# Patient Record
Sex: Female | Born: 1995 | Race: Black or African American | Hispanic: No | Marital: Single | Smoking: Former smoker
Health system: Southern US, Community
[De-identification: ages and names within clinical notes are randomized; demographics above are authoritative.]

## PROBLEM LIST (undated history)

## (undated) DIAGNOSIS — S0990XA Unspecified injury of head, initial encounter: Secondary | ICD-10-CM

## (undated) DIAGNOSIS — J45909 Unspecified asthma, uncomplicated: Secondary | ICD-10-CM

## (undated) DIAGNOSIS — E669 Obesity, unspecified: Secondary | ICD-10-CM

## (undated) DIAGNOSIS — I10 Essential (primary) hypertension: Secondary | ICD-10-CM

## (undated) DIAGNOSIS — Z8489 Family history of other specified conditions: Secondary | ICD-10-CM

## (undated) DIAGNOSIS — M1990 Unspecified osteoarthritis, unspecified site: Secondary | ICD-10-CM

## (undated) HISTORY — PX: TYMPANOSTOMY TUBE PLACEMENT: SHX32

---
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
## 2015-12-24 ENCOUNTER — Encounter (HOSPITAL_COMMUNITY): Payer: Self-pay | Admitting: Emergency Medicine

## 2015-12-24 ENCOUNTER — Ambulatory Visit (HOSPITAL_COMMUNITY)
Admission: EM | Admit: 2015-12-24 | Discharge: 2015-12-24 | Disposition: A | Discharge status: Home / Self Care | Payer: Self-pay | Attending: Family Medicine | Admitting: Family Medicine

## 2015-12-24 DIAGNOSIS — Z202 Contact with and (suspected) exposure to infections with a predominantly sexual mode of transmission: Secondary | ICD-10-CM | POA: Insufficient documentation

## 2015-12-24 NOTE — ED Notes (Signed)
Here for STD testing.... Reports partner is being tested... She is asymptomatic.... A&O x4... No acute distress.

## 2015-12-24 NOTE — Discharge Instructions (Signed)
Contact Precautions °Some germs can be spread by touching the skin or body fluids of someone else. Contact precautions are rules that help to keep those germs from spreading from one person to another person. °RULES FOR PATIENTS °· Check with your nurse before you leave the room where you are being treated. °· Wear a gown and a bandage (dressing) over the infected area if you need to leave the room where you are being treated. °· Wash your hands often. °RULES FOR VISITORS °· Check with a nurse before you go into a room that has a sign that says "Contact Precautions." °· If a nurse says that you can go in the room, wash your hands and put on a gown and gloves. °· Do not take off your gown or gloves in the room until you are leaving. °· Do not eat or drink in the room unless you ask a nurse first. °· Do not touch anything in the room unless you ask a nurse first. °· Right before you leave the room, take off your gown and then your gloves. Leave them in the room as told. Then wash your hands. °  °This information is not intended to replace advice given to you by your health care provider. Make sure you discuss any questions you have with your health care provider. °  °Document Released: 07/29/2010 Document Revised: 11/10/2014 Document Reviewed: 05/31/2014 °Elsevier Interactive Patient Education ©2016 [employer] ° °

## 2015-12-24 NOTE — ED Provider Notes (Signed)
CSN: [id number]     Arrival date & time 12/24/15  1556 History   None    Chief Complaint  Patient presents with  . Exposure to STD   (Consider location/radiation/quality/duration/timing/severity/associated sxs/prior Treatment) HPI Comments: Patient wants to be checked for STD.  Her boyfriend developed urethritis and urethral discharge and she wants to make sure she does not have std.  She denies any sx's.  Patient is a 20 y.o. female presenting with STD exposure. The history is provided by the patient.  Exposure to STD This is a new problem. The current episode started 12 to 24 hours ago. The problem occurs constantly. The problem has not changed since onset.Nothing aggravates the symptoms. Nothing relieves the symptoms. She has tried nothing for the symptoms.    Past Medical History  Diagnosis Date  . Arthritis    Past Surgical History  Procedure Laterality Date  . Tympanostomy tube placement     No family history on file. Social History  Substance Use Topics  . Smoking status: Current Every Day Smoker -- 2.00 packs/day    Types: Cigarettes  . Smokeless tobacco: None  . Alcohol Use: Yes     Comment: occ   OB History    No data available     Review of Systems  Constitutional: Negative.   HENT: Negative.   Eyes: Negative.   Respiratory: Negative.   Cardiovascular: Negative.   Gastrointestinal: Negative.   Endocrine: Negative.   Genitourinary: Negative.   Musculoskeletal: Negative.   Skin: Negative.   Allergic/Immunologic: Negative.   Neurological: Negative.   Hematological: Negative.   Psychiatric/Behavioral: Negative.     Allergies  Amoxicillin  Home Medications   Prior to Admission medications   Medication Sig Start Date End Date Taking? Authorizing Provider  ibuprofen (ADVIL,MOTRIN) 800 MG tablet Take 1 tablet (800 mg total) by mouth 3 (three) times daily. 11/16/14   Hannah Muthersbaugh, PA-C   Meds Ordered and Administered this Visit  Medications - No  data to display  BP 135/81 mmHg  Pulse 92  Temp(Src) 98.4 F (36.9 C) (Oral)  Resp 18  SpO2 100% No data found.   Physical Exam  Constitutional: She appears well-developed and well-nourished.  HENT:  Head: Normocephalic.  Right Ear: External ear normal.  Left Ear: External ear normal.  Mouth/Throat: Oropharynx is clear and moist.  Eyes: Conjunctivae are normal. Pupils are equal, round, and reactive to light.  Cardiovascular: Normal rate, regular rhythm and normal heart sounds.   Pulmonary/Chest: Effort normal and breath sounds normal.  Abdominal: Soft.  Genitourinary: Vagina normal and uterus normal.  No CMT No adenexal tenderness    ED Course  Procedures (including critical care time)  Labs Review Labs Reviewed  CERVICOVAGINAL ANCILLARY ONLY    Imaging Review No results found.   Visual Acuity Review  Right Eye Distance:   Left Eye Distance:   Bilateral Distance:    Right Eye Near:   Left Eye Near:    Bilateral Near:         MDM  STD exposure Cytology endocervical cx for GC chlamydia and trich.  No PID or pelvic infection seen.  Normal female exam.  Recommend barrier methods until test result.     Deatra CanterWilliam J Oxford, FNP 12/24/15 [phone]

## 2015-12-24 NOTE — ED Notes (Signed)
Call back number verified.... Adv not have SI for x10 days.... Pt verb understading.

## 2015-12-27 LAB — CERVICOVAGINAL ANCILLARY ONLY
Chlamydia: NEGATIVE
Neisseria Gonorrhea: NEGATIVE

## 2015-12-28 LAB — CERVICOVAGINAL ANCILLARY ONLY: Wet Prep (BD Affirm): POSITIVE — AB

## 2016-01-01 ENCOUNTER — Telehealth (HOSPITAL_COMMUNITY): Payer: Self-pay | Admitting: Emergency Medicine

## 2016-01-01 NOTE — ED Notes (Signed)
LM on pt's VM [phone] Need to give lab results from recent visit on  Also let pt know labs can be obtained from MyChart  Per Dr. Honig,  Notes Recorded by Erin J Honig, MD on 12/29/2015 at 6:27 PM Vaginal swabs positive for BV. Negative for chlamydia and gonorrhea. Review of chart shows patient was asymptomatic. Asymptomatic BV does not require treatment. If she has developed a vaginal discharge, please call in Flagyl 500mg BID x7 days #14 no refills. Follow up as needed 

## 2016-01-03 NOTE — ED Notes (Signed)
LM on pt's VM [phone] Need to give lab results from recent visit on  Also let pt know labs can be obtained from MyChart  Per Dr. Piedad ClimesHonig,  Notes Recorded by Charm RingsErin J Honig, MD on 12/29/2015 at 6:27 PM Vaginal swabs positive for BV. Negative for chlamydia and gonorrhea. Review of chart shows patient was asymptomatic. Asymptomatic BV does not require treatment. If she has developed a vaginal discharge, please call in Flagyl 500mg  BID x7 days #14 no refills. Follow up as needed

## 2017-04-15 ENCOUNTER — Encounter (HOSPITAL_COMMUNITY): Payer: Self-pay | Admitting: *Deleted

## 2017-04-15 ENCOUNTER — Emergency Department (HOSPITAL_COMMUNITY)
Admission: EM | Admit: 2017-04-15 | Discharge: 2017-04-15 | Disposition: A | Discharge status: Home / Self Care | Payer: Non-veteran care | Attending: Emergency Medicine | Admitting: Emergency Medicine

## 2017-04-15 DIAGNOSIS — F17210 Nicotine dependence, cigarettes, uncomplicated: Secondary | ICD-10-CM | POA: Insufficient documentation

## 2017-04-15 DIAGNOSIS — R109 Unspecified abdominal pain: Secondary | ICD-10-CM | POA: Insufficient documentation

## 2017-04-15 DIAGNOSIS — R197 Diarrhea, unspecified: Secondary | ICD-10-CM | POA: Insufficient documentation

## 2017-04-15 DIAGNOSIS — R112 Nausea with vomiting, unspecified: Secondary | ICD-10-CM | POA: Insufficient documentation

## 2017-04-15 DIAGNOSIS — Z79899 Other long term (current) drug therapy: Secondary | ICD-10-CM | POA: Insufficient documentation

## 2017-04-15 HISTORY — DX: Obesity, unspecified: E66.9

## 2017-04-15 LAB — PREGNANCY, URINE: Preg Test, Ur: NEGATIVE

## 2017-04-15 LAB — COMPREHENSIVE METABOLIC PANEL
ALK PHOS: 72 U/L (ref 38–126)
ALT: 13 U/L — ABNORMAL LOW (ref 14–54)
AST: 19 U/L (ref 15–41)
Albumin: 3.6 g/dL (ref 3.5–5.0)
Anion gap: 12 (ref 5–15)
BUN: 11 mg/dL (ref 6–20)
CALCIUM: 8.8 mg/dL — AB (ref 8.9–10.3)
CO2: 19 mmol/L — ABNORMAL LOW (ref 22–32)
CREATININE: 1.09 mg/dL — AB (ref 0.44–1.00)
Chloride: 102 mmol/L (ref 101–111)
GFR calc Af Amer: >60 mL/min (ref >60)
GFR calc non Af Amer: >60 mL/min (ref >60)
Glucose, Bld: 104 mg/dL — ABNORMAL HIGH (ref 65–99)
Potassium: 3.4 mmol/L — ABNORMAL LOW (ref 3.5–5.1)
Sodium: 133 mmol/L — ABNORMAL LOW (ref 135–145)
Total Bilirubin: 0.9 mg/dL (ref 0.3–1.2)
Total Protein: 7.6 g/dL (ref 6.5–8.1)

## 2017-04-15 LAB — URINALYSIS, ROUTINE W REFLEX MICROSCOPIC
Bacteria, UA: NONE SEEN
Bilirubin Urine: NEGATIVE
Glucose, UA: NEGATIVE mg/dL
KETONES UR: 20 mg/dL — AB
NITRITE: NEGATIVE
Protein, ur: >=300 mg/dL — AB
RBC / HPF: NONE SEEN RBC/hpf (ref 0–5)
Specific Gravity, Urine: 1.026 (ref 1.005–1.030)
WBC UA: NONE SEEN WBC/hpf (ref 0–5)
pH: 5 (ref 5.0–8.0)

## 2017-04-15 LAB — CBC
HCT: 36.6 % (ref 36.0–46.0)
Hemoglobin: 12.4 g/dL (ref 12.0–15.0)
MCH: 30.4 pg (ref 26.0–34.0)
MCHC: 33.9 g/dL (ref 30.0–36.0)
MCV: 89.7 fL (ref 78.0–100.0)
PLATELETS: 151 10*3/uL (ref 150–400)
RBC: 4.08 MIL/uL (ref 3.87–5.11)
RDW: 13.1 % (ref 11.5–15.5)
WBC: 14.1 10*3/uL — AB (ref 4.0–10.5)

## 2017-04-15 LAB — I-STAT BETA HCG BLOOD, ED (MC, WL, AP ONLY): HCG, QUANTITATIVE: 10.1 m[IU]/mL — AB (ref <5)

## 2017-04-15 LAB — LIPASE, BLOOD: LIPASE: 23 U/L (ref 11–51)

## 2017-04-15 MED ORDER — ALUM & MAG HYDROXIDE-SIMETH 200-200-20 MG/5ML PO SUSP
30.0000 mL | Freq: Once | ORAL | Status: AC
  Administered 2017-04-15: 30 mL via ORAL
  Filled 2017-04-15: qty 30

## 2017-04-15 MED ORDER — ONDANSETRON 4 MG PO TBDP
ORAL_TABLET | ORAL | Status: AC
  Filled 2017-04-15: qty 1

## 2017-04-15 MED ORDER — DICYCLOMINE HCL 20 MG PO TABS: 20.0000 mg | ORAL_TABLET | Freq: Three times a day (TID) | ORAL | 0 refills | Status: DC | PRN

## 2017-04-15 MED ORDER — DICYCLOMINE HCL 10 MG PO CAPS
10.0000 mg | ORAL_CAPSULE | Freq: Once | ORAL | Status: AC
  Administered 2017-04-15: 10 mg via ORAL
  Filled 2017-04-15: qty 1

## 2017-04-15 MED ORDER — ONDANSETRON 4 MG PO TBDP
4.0000 mg | ORAL_TABLET | Freq: Once | ORAL | Status: AC | PRN
  Administered 2017-04-15: 4 mg via ORAL

## 2017-04-15 MED ORDER — ONDANSETRON 4 MG PO TBDP: 4.0000 mg | ORAL_TABLET | Freq: Three times a day (TID) | ORAL | 0 refills | Status: AC | PRN

## 2017-04-15 NOTE — ED Notes (Signed)
Cup of ice water provided to patient - per Dr. Eudelia Bunch. Instructed patient to only take small sips slowly. Verbalized understanding.

## 2017-04-15 NOTE — ED Triage Notes (Signed)
Pt reports having generalized abd pain with n/v/d x 2 days.

## 2017-04-15 NOTE — ED Provider Notes (Signed)
[facility]-EMERGENCY DEPT Provider Note   CSN: [id number] Arrival date & time: 04/15/17  1356     History   Chief Complaint Chief Complaint  Patient presents with  . Abdominal Pain  . Emesis    HPI Dawn Moody is a 21 y.o. female.  The history is provided by the patient.  Abdominal Pain   This is a new problem. The current episode started 2 days ago. Episode frequency: intermittent. Progression since onset: fluctuating. The pain is associated with suspicious food intake. Pain location: lower abdomen. The quality of the pain is cramping. The pain is moderate. Associated symptoms include diarrhea (NB), nausea and vomiting (NBNB). Pertinent negatives include anorexia, fever, hematochezia, melena, dysuria and myalgias. Nothing aggravates the symptoms. Nothing relieves the symptoms.    Past Medical History:  Diagnosis Date  . Arthritis   . Obesity     There are no active problems to display for this patient.   Past Surgical History:  Procedure Laterality Date  . TYMPANOSTOMY TUBE PLACEMENT      OB History    No data available       Home Medications    Prior to Admission medications   Medication Sig Start Date End Date Taking? Authorizing Provider  dicyclomine (BENTYL) 20 MG tablet Take 1 tablet (20 mg total) by mouth 3 (three) times daily as needed for spasms. 04/15/17 04/20/17  Nira Conn, MD  ibuprofen (ADVIL,MOTRIN) 800 MG tablet Take 1 tablet (800 mg total) by mouth 3 (three) times daily. 11/16/14   Muthersbaugh, Dahlia Client, PA-C  ondansetron (ZOFRAN ODT) 4 MG disintegrating tablet Take 1 tablet (4 mg total) by mouth every 8 (eight) hours as needed for nausea or vomiting. 04/15/17 04/18/17  Nira Conn, MD    Family History History reviewed. No pertinent family history.  Social History Social History  Substance Use Topics  . Smoking status: Current Every Day Smoker    Packs/day: 2.00    Types: Cigarettes  . Smokeless tobacco: Not on file  .  Alcohol use Yes     Comment: occ     Allergies   Amoxicillin   Review of Systems Review of Systems  Constitutional: Negative for fever.  Gastrointestinal: Positive for diarrhea (NB), nausea and vomiting (NBNB). Negative for anal bleeding, anorexia, blood in stool, hematochezia and melena.  Genitourinary: Positive for vaginal bleeding (currently on her menstrual cycle.). Negative for dysuria, flank pain and vaginal discharge (no sexual intercourse for 7 months).  Musculoskeletal: Negative for myalgias.  All other systems are reviewed and are negative for acute change except as noted in the HPI    Physical Exam Updated Vital Signs BP 123/65 (BP Location: Left Arm)   Pulse 100   Temp 99.2 F (37.3 C) (Oral)   Resp 18   LMP 04/15/2017   SpO2 98%   Physical Exam  Constitutional: She is oriented to person, place, and time. She appears well-developed and well-nourished. No distress.  HENT:  Head: Normocephalic and atraumatic.  Nose: Nose normal.  Eyes: Pupils are equal, round, and reactive to light. Conjunctivae and EOM are normal. Right eye exhibits no discharge. Left eye exhibits no discharge. No scleral icterus.  Neck: Normal range of motion. Neck supple.  Cardiovascular: Normal rate and regular rhythm.  Exam reveals no gallop and no friction rub.   No murmur heard. Pulmonary/Chest: Effort normal and breath sounds normal. No stridor. No respiratory distress. She has no rales.  Abdominal: Soft. She exhibits no distension. There is tenderness (mild  discomfort) in the left lower quadrant. There is no rigidity, no rebound, no guarding, no CVA tenderness, no tenderness at McBurney's point and negative Murphy's sign.  Musculoskeletal: She exhibits no edema or tenderness.  Neurological: She is alert and oriented to person, place, and time.  Skin: Skin is warm and dry. No rash noted. She is not diaphoretic. No erythema.  Psychiatric: She has a normal mood and affect.  Vitals  reviewed.    ED Treatments / Results  Labs (all labs ordered are listed, but only abnormal results are displayed) Labs Reviewed  COMPREHENSIVE METABOLIC PANEL - Abnormal; Notable for the following:       Result Value   Sodium 133 (*)    Potassium 3.4 (*)    CO2 19 (*)    Glucose, Bld 104 (*)    Creatinine, Ser 1.09 (*)    Calcium 8.8 (*)    ALT 13 (*)    All other components within normal limits  CBC - Abnormal; Notable for the following:    WBC 14.1 (*)    All other components within normal limits  URINALYSIS, ROUTINE W REFLEX MICROSCOPIC - Abnormal; Notable for the following:    Color, Urine AMBER (*)    APPearance CLOUDY (*)    Hgb urine dipstick LARGE (*)    Ketones, ur 20 (*)    Protein, ur >=300 (*)    Leukocytes, UA SMALL (*)    Squamous Epithelial / LPF 0-5 (*)    All other components within normal limits  I-STAT BETA HCG BLOOD, ED ([facility], [facility], [facility] ONLY) - Abnormal; Notable for the following:    I-stat hCG, quantitative 10.1 (*)    All other components within normal limits  LIPASE, BLOOD  PREGNANCY, URINE    EKG  EKG Interpretation None       Radiology No results found.  Procedures Procedures (including critical care time)  Medications Ordered in ED Medications  ondansetron (ZOFRAN-ODT) 4 MG disintegrating tablet (not administered)  ondansetron (ZOFRAN-ODT) disintegrating tablet 4 mg (4 mg Oral Given 04/15/17 1406)  dicyclomine (BENTYL) capsule 10 mg (10 mg Oral Given 04/15/17 1629)  alum & mag hydroxide-simeth (MAALOX/MYLANTA) 200-200-20 MG/5ML suspension 30 mL (30 mLs Oral Given 04/15/17 1630)     Initial Impression / Assessment and Plan / ED Course  I have reviewed the triage vital signs and the nursing notes.  Pertinent labs & imaging results that were available during my care of the patient were reviewed by me and considered in my medical decision making (see chart for details).     21 y.o. female presents with vomiting, diarrhea, and fever for 2  days. Possible suspicious food intake.  Decreased oral tolerance. Rest of history as above.  Patient appears well, not in distress, and with no signs of toxicity or dehydration. Abdomen with mild discomfort, but not peritonitic. Rest of the exam as above  Labs with leukocytosis, evidence of dehydration. UA w/o infection.  Most consistent with viral gastroenteritis vs food poisoning.   Doubt appendicitis, diverticulitis, severe colitis, dysentery.    Able to tolerate oral intake in the ED.  Discussed symptomatic treatment with the patient and they will follow closely with their PCP.   Final Clinical Impressions(s) / ED Diagnoses   Final diagnoses:  Nausea vomiting and diarrhea  Abdominal cramping   Disposition: Discharge  Condition: Good  I have discussed the results, Dx and Tx plan with the patient who expressed understanding and agree(s) with the plan. Discharge instructions discussed  at great length. The patient was given strict return precautions who verbalized understanding of the instructions. No further questions at time of discharge.    New Prescriptions   DICYCLOMINE (BENTYL) 20 MG TABLET    Take 1 tablet (20 mg total) by mouth 3 (three) times daily as needed for spasms.   ONDANSETRON (ZOFRAN ODT) 4 MG DISINTEGRATING TABLET    Take 1 tablet (4 mg total) by mouth every 8 (eight) hours as needed for nausea or vomiting.    Follow Up: primary care provider  Schedule an appointment as soon as possible for a visit  in 5-7 days, If symptoms do not improve or  worsen      Cardama, Amadeo Garnet, MD 04/15/17 1710

## 2017-11-11 ENCOUNTER — Emergency Department (HOSPITAL_COMMUNITY)
Admission: EM | Admit: 2017-11-11 | Discharge: 2017-11-11 | Disposition: A | Discharge status: Home / Self Care | Payer: Non-veteran care | Attending: Emergency Medicine | Admitting: Emergency Medicine

## 2017-11-11 ENCOUNTER — Encounter (HOSPITAL_COMMUNITY): Payer: Self-pay | Admitting: Emergency Medicine

## 2017-11-11 ENCOUNTER — Other Ambulatory Visit: Payer: Self-pay

## 2017-11-11 DIAGNOSIS — R1084 Generalized abdominal pain: Secondary | ICD-10-CM | POA: Diagnosis not present

## 2017-11-11 DIAGNOSIS — Z5321 Procedure and treatment not carried out due to patient leaving prior to being seen by health care provider: Secondary | ICD-10-CM | POA: Diagnosis not present

## 2017-11-11 LAB — I-STAT BETA HCG BLOOD, ED (MC, WL, AP ONLY): I-stat hCG, quantitative: <5 m[IU]/mL (ref <5)

## 2017-11-11 NOTE — ED Triage Notes (Signed)
Pt. Stated, I took a pregnancy test on Mar. 14. And it was positive, Ive took several test and it was positive. 4 days ago Dawn Moody started having some abdominal cramping and having bad vaginal bleeding and now just spotting.

## 2017-11-11 NOTE — ED Notes (Signed)
Pt called for room with no response.  Unable to be located in bathrooms or waiting room.

## 2017-11-11 NOTE — ED Notes (Signed)
Pt stating she needs to leave by 11 to pay rent.  Notified that we cannot promise discharge by 11 am.

## 2018-02-25 ENCOUNTER — Encounter (HOSPITAL_COMMUNITY): Payer: Self-pay | Admitting: Emergency Medicine

## 2018-02-25 ENCOUNTER — Emergency Department (HOSPITAL_COMMUNITY)
Admission: EM | Admit: 2018-02-25 | Discharge: 2018-02-25 | Disposition: A | Discharge status: Home / Self Care | Payer: Self-pay | Attending: Emergency Medicine | Admitting: Emergency Medicine

## 2018-02-25 DIAGNOSIS — F17210 Nicotine dependence, cigarettes, uncomplicated: Secondary | ICD-10-CM | POA: Insufficient documentation

## 2018-02-25 DIAGNOSIS — R110 Nausea: Secondary | ICD-10-CM

## 2018-02-25 DIAGNOSIS — Z79899 Other long term (current) drug therapy: Secondary | ICD-10-CM | POA: Insufficient documentation

## 2018-02-25 DIAGNOSIS — G43909 Migraine, unspecified, not intractable, without status migrainosus: Secondary | ICD-10-CM | POA: Insufficient documentation

## 2018-02-25 DIAGNOSIS — R51 Headache: Secondary | ICD-10-CM

## 2018-02-25 DIAGNOSIS — R519 Headache, unspecified: Secondary | ICD-10-CM

## 2018-02-25 LAB — COMPREHENSIVE METABOLIC PANEL
ALBUMIN: 4.5 g/dL (ref 3.5–5.0)
ALT: 17 U/L (ref 0–44)
ANION GAP: 10 (ref 5–15)
AST: 21 U/L (ref 15–41)
Alkaline Phosphatase: 73 U/L (ref 38–126)
BILIRUBIN TOTAL: 1.2 mg/dL (ref 0.3–1.2)
BUN: 15 mg/dL (ref 6–20)
CALCIUM: 9.9 mg/dL (ref 8.9–10.3)
CO2: 25 mmol/L (ref 22–32)
Chloride: 103 mmol/L (ref 98–111)
Creatinine, Ser: 1.01 mg/dL — ABNORMAL HIGH (ref 0.44–1.00)
GFR calc Af Amer: >60 mL/min (ref >60)
GLUCOSE: 97 mg/dL (ref 70–99)
Potassium: 4.3 mmol/L (ref 3.5–5.1)
Sodium: 138 mmol/L (ref 135–145)
TOTAL PROTEIN: 7.7 g/dL (ref 6.5–8.1)

## 2018-02-25 LAB — URINALYSIS, ROUTINE W REFLEX MICROSCOPIC
Bilirubin Urine: NEGATIVE
GLUCOSE, UA: NEGATIVE mg/dL
Hgb urine dipstick: NEGATIVE
Ketones, ur: 5 mg/dL — AB
NITRITE: NEGATIVE
PH: 6 (ref 5.0–8.0)
Protein, ur: 30 mg/dL — AB
Specific Gravity, Urine: 1.029 (ref 1.005–1.030)

## 2018-02-25 LAB — I-STAT BETA HCG BLOOD, ED (MC, WL, AP ONLY): I-stat hCG, quantitative: <5 m[IU]/mL (ref <5)

## 2018-02-25 LAB — CBC
HCT: 42.2 % (ref 36.0–46.0)
HEMOGLOBIN: 13.4 g/dL (ref 12.0–15.0)
MCH: 30 pg (ref 26.0–34.0)
MCHC: 31.8 g/dL (ref 30.0–36.0)
MCV: 94.4 fL (ref 78.0–100.0)
Platelets: 189 10*3/uL (ref 150–400)
RBC: 4.47 MIL/uL (ref 3.87–5.11)
RDW: 11.9 % (ref 11.5–15.5)
WBC: 5.2 10*3/uL (ref 4.0–10.5)

## 2018-02-25 LAB — LIPASE, BLOOD: Lipase: 29 U/L (ref 11–51)

## 2018-02-25 MED ORDER — ONDANSETRON 4 MG PO TBDP: 4.0000 mg | ORAL_TABLET | Freq: Three times a day (TID) | ORAL | 0 refills | Status: DC | PRN

## 2018-02-25 MED ORDER — KETOROLAC TROMETHAMINE 30 MG/ML IJ SOLN
30.0000 mg | Freq: Once | INTRAMUSCULAR | Status: AC
  Administered 2018-02-25: 30 mg via INTRAMUSCULAR
  Filled 2018-02-25: qty 1

## 2018-02-25 MED ORDER — ONDANSETRON 4 MG PO TBDP
4.0000 mg | ORAL_TABLET | Freq: Once | ORAL | Status: AC | PRN
  Administered 2018-02-25: 4 mg via ORAL
  Filled 2018-02-25: qty 1

## 2018-02-25 NOTE — Discharge Instructions (Addendum)
Please read attached information. If you experience any new or worsening signs or symptoms please return to the emergency room for evaluation. Please follow-up with your primary care provider or specialist as discussed. Please use medication prescribed only as directed and discontinue taking if you have any concerning signs or symptoms.   °

## 2018-02-25 NOTE — ED Triage Notes (Signed)
Pt to ER for 2 days nausea, vomiting. Reports headache but no abdominal pain. Pt in NAD. A/o x4. VSS.

## 2018-02-25 NOTE — ED Provider Notes (Signed)
[facility]   CSN: [id number] Arrival date & time: 02/25/18  1708     History   Chief Complaint Chief Complaint  Patient presents with  . Nausea  . Emesis    HPI Dawn Moody is a 22 y.o. female.  HPI   22 year old female presents today with complaints of nausea and vomiting. Patient notes over the last several weeks she's had intermittent nausea. She has had worsening symptoms over the last 2 days. She notes that when she gets severely anxious or stressed she develops nausea and has vomiting. She denies any associated abdominal pain, fever or chills. She denies any urinary complaints, vaginal discharge or bleeding. No weight loss fever or night sweats. Patient notes that today she is having a generalized headache, not severe, or neurological deficits, no neck stiffness, again no fever.  Past Medical History:  Diagnosis Date  . Arthritis   . Obesity     There are no active problems to display for this patient.   Past Surgical History:  Procedure Laterality Date  . TYMPANOSTOMY TUBE PLACEMENT       OB History   None      Home Medications    Prior to Admission medications   Medication Sig Start Date End Date Taking? Authorizing Provider  dicyclomine (BENTYL) 20 MG tablet Take 1 tablet (20 mg total) by mouth 3 (three) times daily as needed for spasms. 04/15/17 02/25/18 Yes Cardama, Amadeo GarnetPedro Eduardo, MD  ibuprofen (ADVIL,MOTRIN) 800 MG tablet Take 1 tablet (800 mg total) by mouth 3 (three) times daily. 11/16/14  Yes Muthersbaugh, Dahlia ClientHannah, PA-C  LISINOPRIL PO Take 1 tablet by mouth daily.   Yes [provider]    Family History History reviewed. No pertinent family history.  Social History Social History   Tobacco Use  . Smoking status: Current Every Day Smoker    Packs/day: 2.00    Types: Cigarettes  . Smokeless tobacco: Current User  Substance Use Topics  . Alcohol use: Yes    Comment: occ  . Drug  use: No     Allergies   Amoxicillin   Review of Systems Review of Systems  All other systems reviewed and are negative.    Physical Exam Updated Vital Signs BP (!) 141/85 (BP Location: Right Arm)   Pulse 77   Temp 97.9 F (36.6 C) (Oral)   Resp 18   SpO2 100%   Physical Exam  Constitutional: She is oriented to person, place, and time. She appears well-developed and well-nourished.  HENT:  Head: Normocephalic and atraumatic.  Eyes: Pupils are equal, round, and reactive to light. Conjunctivae are normal. Right eye exhibits no discharge. Left eye exhibits no discharge. No scleral icterus.  Neck: Normal range of motion. No JVD present. No tracheal deviation present.  Pulmonary/Chest: Effort normal. No stridor.  Abdominal: Soft. She exhibits no distension and no mass. There is no tenderness. There is no rebound and no guarding. No hernia.  Neurological: She is alert and oriented to person, place, and time. Coordination normal.  Psychiatric: She has a normal mood and affect. Her behavior is normal. Judgment and thought content normal.  Nursing note and vitals reviewed.    ED Treatments / Results  Labs (all labs ordered are listed, but only abnormal results are displayed) Labs Reviewed  COMPREHENSIVE METABOLIC PANEL - Abnormal; Notable for the following components:      Result Value   Creatinine, Ser 1.01 (*)    All other  components within normal limits  URINALYSIS, ROUTINE W REFLEX MICROSCOPIC - Abnormal; Notable for the following components:   APPearance HAZY (*)    Ketones, ur 5 (*)    Protein, ur 30 (*)    Leukocytes, UA TRACE (*)    Bacteria, UA RARE (*)    All other components within normal limits  LIPASE, BLOOD  CBC  I-STAT BETA HCG BLOOD, ED ([facility], [facility], [facility] ONLY)    EKG None  Radiology No results found.  Procedures Procedures (including critical care time)  Medications Ordered in ED Medications  ketorolac (TORADOL) 30 MG/ML injection 30 mg (has no  administration in time range)  ondansetron (ZOFRAN-ODT) disintegrating tablet 4 mg (4 mg Oral Given 02/25/18 1738)     Initial Impression / Assessment and Plan / ED Course  I have reviewed the triage vital signs and the nursing notes.  Pertinent labs & imaging results that were available during my care of the patient were reviewed by me and considered in my medical decision making (see chart for details).    Labs: I-STAT beta Hcg, lipase, cmp, cbc, UA  Imaging:  Consults:  Therapeutics: Toradol  Discharge Meds:   Assessment/Plan:   22 year old female presents today with complaints of nausea and vomiting. Uncertain etiology at this time. She has no signs of infectious etiology, symptoms improved with Zofran here. Patient will be discharged with Zofran, encouragement to start healthy diet, outpatient follow-up primary care, strict return precautions given. Patient also with minor headache here, have very low suspicion for the symptoms being related as the patient does not generally have a headache, low suspicion for increased ICP. She verbalized understanding and agreement to today's plan had no further questions or concerns.  Final Clinical Impressions(s) / ED Diagnoses   Final diagnoses:  Acute nonintractable headache, unspecified headache type    ED Discharge Orders    None       Rosalio LoudHedges, Edson Deridder, PA-C 02/25/18 2008    Curatolo, Adam, DO 02/25/18 2336

## 2019-05-02 ENCOUNTER — Encounter (HOSPITAL_COMMUNITY): Payer: Self-pay | Admitting: Emergency Medicine

## 2019-05-02 ENCOUNTER — Emergency Department (HOSPITAL_COMMUNITY)
Admission: EM | Admit: 2019-05-02 | Discharge: 2019-05-02 | Disposition: A | Discharge status: Home / Self Care | Payer: Self-pay | Attending: Emergency Medicine | Admitting: Emergency Medicine

## 2019-05-02 ENCOUNTER — Emergency Department (HOSPITAL_COMMUNITY): Payer: Self-pay

## 2019-05-02 ENCOUNTER — Other Ambulatory Visit: Payer: Self-pay

## 2019-05-02 DIAGNOSIS — F17220 Nicotine dependence, chewing tobacco, uncomplicated: Secondary | ICD-10-CM | POA: Insufficient documentation

## 2019-05-02 DIAGNOSIS — R1031 Right lower quadrant pain: Secondary | ICD-10-CM | POA: Insufficient documentation

## 2019-05-02 DIAGNOSIS — F17210 Nicotine dependence, cigarettes, uncomplicated: Secondary | ICD-10-CM | POA: Insufficient documentation

## 2019-05-02 LAB — COMPREHENSIVE METABOLIC PANEL
ALT: 11 U/L (ref 0–44)
AST: 13 U/L — ABNORMAL LOW (ref 15–41)
Albumin: 4.4 g/dL (ref 3.5–5.0)
Alkaline Phosphatase: 53 U/L (ref 38–126)
Anion gap: 5 (ref 5–15)
BUN: 10 mg/dL (ref 6–20)
CO2: 27 mmol/L (ref 22–32)
Calcium: 9.3 mg/dL (ref 8.9–10.3)
Chloride: 106 mmol/L (ref 98–111)
Creatinine, Ser: 0.77 mg/dL (ref 0.44–1.00)
GFR calc Af Amer: >60 mL/min (ref >60)
GFR calc non Af Amer: >60 mL/min (ref >60)
Glucose, Bld: 72 mg/dL (ref 70–99)
Potassium: 4 mmol/L (ref 3.5–5.1)
Sodium: 138 mmol/L (ref 135–145)
Total Bilirubin: 0.7 mg/dL (ref 0.3–1.2)
Total Protein: 7.6 g/dL (ref 6.5–8.1)

## 2019-05-02 LAB — URINALYSIS, ROUTINE W REFLEX MICROSCOPIC
Bilirubin Urine: NEGATIVE
Glucose, UA: NEGATIVE mg/dL
Hgb urine dipstick: NEGATIVE
Ketones, ur: NEGATIVE mg/dL
Leukocytes,Ua: NEGATIVE
Nitrite: NEGATIVE
Protein, ur: NEGATIVE mg/dL
Specific Gravity, Urine: 1.019 (ref 1.005–1.030)
pH: 6 (ref 5.0–8.0)

## 2019-05-02 LAB — CBC
HCT: 41.4 % (ref 36.0–46.0)
Hemoglobin: 13.4 g/dL (ref 12.0–15.0)
MCH: 31.3 pg (ref 26.0–34.0)
MCHC: 32.4 g/dL (ref 30.0–36.0)
MCV: 96.7 fL (ref 80.0–100.0)
Platelets: 180 10*3/uL (ref 150–400)
RBC: 4.28 MIL/uL (ref 3.87–5.11)
RDW: 12.1 % (ref 11.5–15.5)
WBC: 3.7 10*3/uL — ABNORMAL LOW (ref 4.0–10.5)
nRBC: 0 % (ref 0.0–0.2)

## 2019-05-02 LAB — I-STAT BETA HCG BLOOD, ED (MC, WL, AP ONLY): I-stat hCG, quantitative: <5 m[IU]/mL (ref <5)

## 2019-05-02 LAB — LIPASE, BLOOD: Lipase: 21 U/L (ref 11–51)

## 2019-05-02 MED ORDER — KETOROLAC TROMETHAMINE 30 MG/ML IJ SOLN
30.0000 mg | Freq: Once | INTRAMUSCULAR | Status: AC
Start: 2019-05-02 — End: 2019-05-02
  Administered 2019-05-02: 30 mg via INTRAVENOUS
  Filled 2019-05-02: qty 1

## 2019-05-02 MED ORDER — IOHEXOL 300 MG/ML  SOLN
100.0000 mL | Freq: Once | INTRAMUSCULAR | Status: AC | PRN
  Administered 2019-05-02: 19:00:00 100 mL via INTRAVENOUS

## 2019-05-02 MED ORDER — SODIUM CHLORIDE (PF) 0.9 % IJ SOLN
INTRAMUSCULAR | Status: AC
  Filled 2019-05-02: qty 50

## 2019-05-02 NOTE — ED Triage Notes (Signed)
Pt reports she was at home "dancing to [employer]" and all of sudden got pain in right side of abd. Pain is worse with movements and will come in waves. Certain positions will stop pain.

## 2019-05-02 NOTE — ED Provider Notes (Signed)
[place] DEPT Provider Note   CSN: [id number] Arrival date & time: 05/02/19  1502     History   Chief Complaint Chief Complaint  Patient presents with   Abdominal Pain    HPI Dawn Moody is a 23 y.o. female without significant PMHx, presenting to the ED with complaint of sudden onset of right lower quadrant abdominal pain that began around 1 PM today. Her pain his minimal at rest though worsened with any movement and sneezing. She describes it as RLQ radiating towards her umbilicus. No assoc urinary sx, pelvic complaints, N/V/D, fever, or appetite changes. No hx of abdominal surgery. No medications tried PTA.     The history is provided by the patient.    Past Medical History:  Diagnosis Date   Arthritis    Obesity     There are no active problems to display for this patient.   Past Surgical History:  Procedure Laterality Date   TYMPANOSTOMY TUBE PLACEMENT       OB History   No obstetric history on file.      Home Medications    Prior to Admission medications   Medication Sig Start Date End Date Taking? Authorizing Provider  dicyclomine (BENTYL) 20 MG tablet Take 1 tablet (20 mg total) by mouth 3 (three) times daily as needed for spasms. 04/15/17 02/25/18  Fatima Blank, MD  ibuprofen (ADVIL,MOTRIN) 800 MG tablet Take 1 tablet (800 mg total) by mouth 3 (three) times daily. Patient not taking: Reported on 05/02/2019 11/16/14   Muthersbaugh, Jarrett Soho, PA-C  ondansetron (ZOFRAN ODT) 4 MG disintegrating tablet Take 1 tablet (4 mg total) by mouth every 8 (eight) hours as needed for nausea or vomiting. Patient not taking: Reported on 05/02/2019 02/25/18   Okey Regal, PA-C    Family History No family history on file.  Social History Social History   Tobacco Use   Smoking status: Current Every Day Smoker    Packs/day: 2.00    Types: Cigarettes   Smokeless tobacco: Current User  Substance Use Topics   Alcohol  use: Yes    Comment: occ   Drug use: No     Allergies   Amoxicillin   Review of Systems Review of Systems  Constitutional: Negative for appetite change and fever.  Gastrointestinal: Positive for abdominal pain. Negative for diarrhea, nausea and vomiting.  Genitourinary: Negative for dysuria, frequency, vaginal bleeding and vaginal discharge.  All other systems reviewed and are negative.    Physical Exam Updated Vital Signs BP 108/63    Pulse (!) 53    Temp 98.7 F (37.1 C) (Oral)    Resp 16    Ht 5\' 10"  (1.778 m)    Wt 102.1 kg    LMP 04/18/2019 Comment: neg preg test   SpO2 100%    BMI 32.28 kg/m   Physical Exam Vitals signs and nursing note reviewed.  Constitutional:      General: She is not in acute distress.    Appearance: She is well-developed. She is not ill-appearing.  HENT:     Head: Normocephalic and atraumatic.  Eyes:     Conjunctiva/sclera: Conjunctivae normal.  Cardiovascular:     Rate and Rhythm: Normal rate and regular rhythm.  Pulmonary:     Effort: Pulmonary effort is normal. No respiratory distress.     Breath sounds: Normal breath sounds.  Abdominal:     General: Bowel sounds are normal.     Palpations: Abdomen is soft.  Tenderness: There is abdominal tenderness in the right lower quadrant. There is no guarding or rebound. Positive signs include Rovsing's sign.  Skin:    General: Skin is warm.  Neurological:     Mental Status: She is alert.  Psychiatric:        Behavior: Behavior normal.      ED Treatments / Results  Labs (all labs ordered are listed, but only abnormal results are displayed) Labs Reviewed  COMPREHENSIVE METABOLIC PANEL - Abnormal; Notable for the following components:      Result Value   AST 13 (*)    All other components within normal limits  CBC - Abnormal; Notable for the following components:   WBC 3.7 (*)    All other components within normal limits  URINALYSIS, ROUTINE W REFLEX MICROSCOPIC - Abnormal; Notable  for the following components:   APPearance HAZY (*)    All other components within normal limits  LIPASE, BLOOD  I-STAT BETA HCG BLOOD, ED ([facility], [facility], [facility] ONLY)    EKG None  Radiology Ct Abdomen Pelvis W Contrast  Addendum Date: 05/02/2019   ADDENDUM REPORT: 05/02/2019 19:40 ADDENDUM: Correction small left fat containing groin hernia. Electronically Signed   By: Jasmine Pang M.D.   On: 05/02/2019 19:40   Result Date: 05/02/2019 CLINICAL DATA:  Right-sided abdominal pain EXAM: CT ABDOMEN AND PELVIS WITH CONTRAST TECHNIQUE: Multidetector CT imaging of the abdomen and pelvis was performed using the standard protocol following bolus administration of intravenous contrast. CONTRAST:  OMNIPAQUE IOHEXOL 300 MG/ML  SOLN COMPARISON:  None. FINDINGS: Lower chest: No acute abnormality. Hepatobiliary: No focal liver abnormality is seen. No gallstones, gallbladder wall thickening, or biliary dilatation. Pancreas: Unremarkable. No pancreatic ductal dilatation or surrounding inflammatory changes. Spleen: Normal in size without focal abnormality. Adrenals/Urinary Tract: Adrenal glands are unremarkable. Kidneys are normal, without renal calculi, focal lesion, or hydronephrosis. Bladder is unremarkable. Stomach/Bowel: Stomach is within normal limits. Appendix appears normal. No evidence of bowel wall thickening, distention, or inflammatory changes. Vascular/Lymphatic: No significant vascular findings are present. No enlarged abdominal or pelvic lymph nodes. Reproductive: Uterus and bilateral adnexa are unremarkable. Other: Small free fluid in the pelvis. No free air. There appears to be a small, non bowel containing left lower spigelian hernia Musculoskeletal: No acute or significant osseous findings. IMPRESSION: 1. No CT evidence for acute intra-abdominal or pelvic abnormality. Negative for appendicitis. 2. Small free fluid in the pelvis. Electronically Signed: By: Jasmine Pang M.D. On: 05/02/2019 19:32     Procedures Procedures (including critical care time)  Medications Ordered in ED Medications  iohexol (OMNIPAQUE) 300 MG/ML solution 100 mL (100 mLs Intravenous Contrast Given 05/02/19 1856)  ketorolac (TORADOL) 30 MG/ML injection 30 mg (30 mg Intravenous Given 05/02/19 1956)     Initial Impression / Assessment and Plan / ED Course  I have reviewed the triage vital signs and the nursing notes.  Pertinent labs & imaging results that were available during my care of the patient were reviewed by me and considered in my medical decision making (see chart for details).        Patient presenting with sudden onset of right lower quadrant abdominal pain that is worse with movement, that began this afternoon.  No associated symptoms.  She is well-appearing and in no distress on evaluation, eating a bag of flaming hot Cheetos.  Patient is afebrile.  Abdominal exam with right lower quadrant tenderness, however no peritoneal signs.  She is denying any pelvic complaints.  Labs are  within normal limits.  UA is negative for infection.  Proceeded with CT scan given patient's presentation concerning for possibility of appendicitis.  Also offered pelvic exam, however patient declined at this time.  Differential also includes ovarian cyst.   CT scan is negative.  Discussed with patient possibility of early appendicitis missed on scan.    Discuss symptomatic management and had discussion with patient about importance of symptom monitoring.  She is instructed return to the emergency department in 24 hours if symptoms do not improve, if they worsen or she develops fever or any other associated symptoms.  She verbalized understanding agrees with care plan.  Final Clinical Impressions(s) / ED Diagnoses   Final diagnoses:  RLQ abdominal pain    ED Discharge Orders    None       Astor Gentle, [place] N, PA-C 05/02/19 2315    Terald Sleeperrifan, Matthew J, MD 05/03/19 1146

## 2019-05-02 NOTE — ED Notes (Signed)
Pt asleep and resting comfortably. 

## 2019-05-02 NOTE — Discharge Instructions (Signed)
Your lab work and CT scan were normal today.   We discussed the possibility of early appendicitis. Please be mindful of your symptoms.  If they persist, worsen, or you develop nausea/vomiting or fever, please return to the emergency department for recheck and further evaluation. You can treat your symptoms with over-the-counter medications such as ibuprofen or Tylenol as needed.

## 2019-05-02 NOTE — ED Notes (Signed)
Pt in with provider  

## 2020-03-10 ENCOUNTER — Other Ambulatory Visit: Payer: Self-pay

## 2020-03-10 DIAGNOSIS — F10129 Alcohol abuse with intoxication, unspecified: Secondary | ICD-10-CM | POA: Insufficient documentation

## 2020-03-10 DIAGNOSIS — Z79899 Other long term (current) drug therapy: Secondary | ICD-10-CM | POA: Insufficient documentation

## 2020-03-10 DIAGNOSIS — F17210 Nicotine dependence, cigarettes, uncomplicated: Secondary | ICD-10-CM | POA: Insufficient documentation

## 2020-03-11 ENCOUNTER — Encounter (HOSPITAL_COMMUNITY): Payer: Self-pay

## 2020-03-11 ENCOUNTER — Emergency Department (HOSPITAL_COMMUNITY)
Admission: EM | Admit: 2020-03-11 | Discharge: 2020-03-11 | Discharge status: Against Medical Advice | Payer: Medicaid Other | Attending: Emergency Medicine | Admitting: Emergency Medicine

## 2020-03-11 DIAGNOSIS — F10929 Alcohol use, unspecified with intoxication, unspecified: Secondary | ICD-10-CM

## 2020-03-11 LAB — COMPREHENSIVE METABOLIC PANEL
ALT: 21 U/L (ref 0–44)
AST: 24 U/L (ref 15–41)
Albumin: 4.9 g/dL (ref 3.5–5.0)
Alkaline Phosphatase: 55 U/L (ref 38–126)
Anion gap: 12 (ref 5–15)
BUN: 10 mg/dL (ref 6–20)
CO2: 21 mmol/L — ABNORMAL LOW (ref 22–32)
Calcium: 8.6 mg/dL — ABNORMAL LOW (ref 8.9–10.3)
Chloride: 100 mmol/L (ref 98–111)
Creatinine, Ser: 0.75 mg/dL (ref 0.44–1.00)
GFR calc Af Amer: >60 mL/min (ref >60)
GFR calc non Af Amer: >60 mL/min (ref >60)
Glucose, Bld: 90 mg/dL (ref 70–99)
Potassium: 3.4 mmol/L — ABNORMAL LOW (ref 3.5–5.1)
Sodium: 133 mmol/L — ABNORMAL LOW (ref 135–145)
Total Bilirubin: 0.7 mg/dL (ref 0.3–1.2)
Total Protein: 8.3 g/dL — ABNORMAL HIGH (ref 6.5–8.1)

## 2020-03-11 LAB — CBC WITH DIFFERENTIAL/PLATELET
Abs Immature Granulocytes: 0.01 10*3/uL (ref 0.00–0.07)
Basophils Absolute: 0 10*3/uL (ref 0.0–0.1)
Basophils Relative: 1 %
Eosinophils Absolute: 0 10*3/uL (ref 0.0–0.5)
Eosinophils Relative: 0 %
HCT: 42.6 % (ref 36.0–46.0)
Hemoglobin: 14.2 g/dL (ref 12.0–15.0)
Immature Granulocytes: 0 %
Lymphocytes Relative: 25 %
Lymphs Abs: 1.3 10*3/uL (ref 0.7–4.0)
MCH: 32.1 pg (ref 26.0–34.0)
MCHC: 33.3 g/dL (ref 30.0–36.0)
MCV: 96.4 fL (ref 80.0–100.0)
Monocytes Absolute: 0.2 10*3/uL (ref 0.1–1.0)
Monocytes Relative: 5 %
Neutro Abs: 3.5 10*3/uL (ref 1.7–7.7)
Neutrophils Relative %: 69 %
Platelets: 202 10*3/uL (ref 150–400)
RBC: 4.42 MIL/uL (ref 3.87–5.11)
RDW: 13 % (ref 11.5–15.5)
WBC: 5 10*3/uL (ref 4.0–10.5)
nRBC: 0 % (ref 0.0–0.2)

## 2020-03-11 LAB — ETHANOL: Alcohol, Ethyl (B): 102 mg/dL — ABNORMAL HIGH (ref <10)

## 2020-03-11 LAB — I-STAT BETA HCG BLOOD, ED (MC, WL, AP ONLY): I-stat hCG, quantitative: <5 m[IU]/mL (ref <5)

## 2020-03-11 LAB — ACETAMINOPHEN LEVEL: Acetaminophen (Tylenol), Serum: 14 ug/mL (ref 10–30)

## 2020-03-11 MED ORDER — SODIUM CHLORIDE 0.9 % IV BOLUS
1000.0000 mL | Freq: Once | INTRAVENOUS | Status: AC
  Administered 2020-03-11: 1000 mL via INTRAVENOUS

## 2020-03-11 NOTE — ED Triage Notes (Signed)
Pt states that she was drinking beer and liquor and then tried to prevent a hangover and took a handful of tylenol, she doesn't know how many Pt denies SI

## 2020-03-11 NOTE — ED Provider Notes (Cosign Needed Addendum)
[facility]-EMERGENCY DEPT Provider Note   CSN: [id number] Arrival date & time: 03/10/20  2346     History No chief complaint on file.   Dawn Moody is a 24 y.o. female.  Pt reports she drank beer and liquor.  Pt states she also took 6 tylenol.  Pt reports she just wanted to not have a hangover.  Pt denies taking any more than that.  Pt states she does not want evaluation.  She has a ride here and wants to leave.  Pt denies suicidal thoughts or attempt.  Pt reports she is safe.    The history is provided by the patient. No language interpreter was used.       Past Medical History:  Diagnosis Date  . Arthritis   . Obesity     There are no problems to display for this patient.   Past Surgical History:  Procedure Laterality Date  . TYMPANOSTOMY TUBE PLACEMENT       OB History   No obstetric history on file.     History reviewed. No pertinent family history.  Social History   Tobacco Use  . Smoking status: Current Every Day Smoker    Packs/day: 2.00    Types: Cigarettes  . Smokeless tobacco: Current User  Vaping Use  . Vaping Use: Former  Substance Use Topics  . Alcohol use: Yes    Comment: occ  . Drug use: No    Home Medications Prior to Admission medications   Medication Sig Start Date End Date Taking? Authorizing Provider  dicyclomine (BENTYL) 20 MG tablet Take 1 tablet (20 mg total) by mouth 3 (three) times daily as needed for spasms. 04/15/17 02/25/18  Nira Conn, MD    Allergies    Amoxicillin  Review of Systems   Review of Systems  All other systems reviewed and are negative.   Physical Exam Updated Vital Signs BP (!) 140/94 (BP Location: Left Arm)   Pulse (!) 104   Temp 99.1 F (37.3 C) (Oral)   Resp 16   Ht 5\' 9"  (1.753 m)   Wt 102.1 kg   SpO2 98%   BMI 33.23 kg/m   Physical Exam Vitals and nursing note reviewed.  Constitutional:      Appearance: She is well-developed.  HENT:     Head:  Normocephalic.  Cardiovascular:     Rate and Rhythm: Normal rate.  Pulmonary:     Effort: Pulmonary effort is normal.  Abdominal:     General: There is no distension.  Musculoskeletal:        General: Normal range of motion.  Neurological:     Mental Status: She is alert and oriented to person, place, and time.     ED Results / Procedures / Treatments   Labs (all labs ordered are listed, but only abnormal results are displayed) Labs Reviewed  ETHANOL  ACETAMINOPHEN LEVEL  COMPREHENSIVE METABOLIC PANEL  CBC WITH DIFFERENTIAL/PLATELET  I-STAT BETA HCG BLOOD, ED ([facility], [facility], [facility] ONLY)    EKG None  Radiology No results found.  Procedures Procedures (including critical care time)  Medications Ordered in ED Medications  sodium chloride 0.9 % bolus 1,000 mL (1,000 mLs Intravenous New Bag/Given (Non-Interop) 03/11/20 0028)    ED Course  I have reviewed the triage vital signs and the nursing notes.  Pertinent labs & imaging results that were available during my care of the patient were reviewed by me and considered in my medical decision making (see chart  for details).    MDM Rules/Calculators/A&P                          MDM:  Pt has chose to leave ama.  Her evaluation is not complete.  Rn will make sure pt has a ride and is not driving.  Pt is awake and alert.  Final Clinical Impression(s) / ED Diagnoses Final diagnoses:  Alcoholic intoxication with complication [facility])    Rx / DC Orders ED Discharge Orders    None    ama   Osie Cheeks 03/11/20 0046    Molpus, Jonny Ruiz, MD 03/11/20 0128    Elson Areas, PA-C 03/11/20 0602

## 2020-03-11 NOTE — ED Notes (Addendum)
Pt requesting AMA discharge Colin Broach PA aware. Pt to home in car with family friend

## 2020-05-26 ENCOUNTER — Encounter (HOSPITAL_COMMUNITY): Payer: Self-pay | Admitting: *Deleted

## 2020-05-26 ENCOUNTER — Emergency Department (HOSPITAL_COMMUNITY)
Admission: EM | Admit: 2020-05-26 | Discharge: 2020-05-26 | Disposition: A | Discharge status: Home / Self Care | Payer: Medicaid Other | Attending: Emergency Medicine | Admitting: Emergency Medicine

## 2020-05-26 ENCOUNTER — Other Ambulatory Visit: Payer: Self-pay

## 2020-05-26 DIAGNOSIS — H9202 Otalgia, left ear: Secondary | ICD-10-CM | POA: Insufficient documentation

## 2020-05-26 DIAGNOSIS — J302 Other seasonal allergic rhinitis: Secondary | ICD-10-CM | POA: Insufficient documentation

## 2020-05-26 DIAGNOSIS — J3489 Other specified disorders of nose and nasal sinuses: Secondary | ICD-10-CM | POA: Insufficient documentation

## 2020-05-26 DIAGNOSIS — H9203 Otalgia, bilateral: Secondary | ICD-10-CM

## 2020-05-26 DIAGNOSIS — F17210 Nicotine dependence, cigarettes, uncomplicated: Secondary | ICD-10-CM | POA: Insufficient documentation

## 2020-05-26 MED ORDER — CLARITIN-D 12 HOUR 5-120 MG PO TB12: 1.0000 | ORAL_TABLET | Freq: Two times a day (BID) | ORAL | 0 refills | Status: DC

## 2020-05-26 NOTE — ED Provider Notes (Signed)
[facility]-EMER[facility]ENC[facility] DEPT Provider Note   CSN: [id number] Arrival date & time: 05/26/20  0028     History Chief Complaint  Patient presents with  . Otalgia    Pauleen Goleman is a 24 y.o. female.  Patient to ED with onset right, then left ear pain, starting earlier tonight. No fever. She reports symptoms of rhinorrhea, irritated eyes "that I get every year". No fever, sore throat, cough. She reports that her hearing is muffled. No nausea or headache.   The history is provided by the patient. No language interpreter was used.  Otalgia Associated symptoms: hearing loss and rhinorrhea   Associated symptoms: no cough, no fever, no headaches and no sore throat        Past Medical History:  Diagnosis Date  . Arthritis   . Obesity     There are no problems to display for this patient.   Past Surgical History:  Procedure Laterality Date  . TYMPANOSTOMY TUBE PLACEMENT       OB History   No obstetric history on file.     No family history on file.  Social History   Tobacco Use  . Smoking status: Current Every Day Smoker    Packs/day: 2.00    Types: Cigarettes  . Smokeless tobacco: Current User  Vaping Use  . Vaping Use: Former  Substance Use Topics  . Alcohol use: Yes    Comment: occ  . Drug use: No    Home Medications Prior to Admission medications   Medication Sig Start Date End Date Taking? Authorizing Provider  dicyclomine (BENTYL) 20 MG tablet Take 1 tablet (20 mg total) by mouth 3 (three) times daily as needed for spasms. 04/15/17 02/25/18  Nira Conn, MD    Allergies    Amoxicillin  Review of Systems   Review of Systems  Constitutional: Negative for chills and fever.  HENT: Positive for ear pain, hearing loss, rhinorrhea and sinus pressure. Negative for sore throat.   Eyes: Positive for itching.  Respiratory: Negative for cough and shortness of breath.   Cardiovascular: Negative for chest pain.    Gastrointestinal: Negative for nausea.  Musculoskeletal: Negative for myalgias.  Neurological: Negative for headaches.    Physical Exam Updated Vital Signs BP (!) 156/100 (BP Location: Right Arm)   Pulse 95   Temp 98.5 F (36.9 C) (Oral)   Resp 17   Ht 5\' 9"  (1.753 m)   Wt 99.8 kg   SpO2 100%   BMI 32.49 kg/m   Physical Exam Constitutional:      Appearance: She is well-developed.  HENT:     Right Ear: No swelling. There is no impacted cerumen. Tympanic membrane is erythematous. Tympanic membrane is not bulging.     Left Ear: No swelling. There is no impacted cerumen. Tympanic membrane is erythematous. Tympanic membrane is not bulging.  Pulmonary:     Effort: Pulmonary effort is normal.  Musculoskeletal:     Cervical back: Normal range of motion.  Skin:    General: Skin is warm and dry.  Neurological:     Mental Status: She is alert and oriented to person, place, and time.     ED Results / Procedures / Treatments   Labs (all labs ordered are listed, but only abnormal results are displayed) Labs Reviewed - No data to display  EKG None  Radiology No results found.  Procedures Procedures (including critical care time)  Medications Ordered in ED Medications - No data to display  ED Course  I have reviewed the triage vital signs and the nursing notes.  Pertinent labs & imaging results that were available during my care of the patient were reviewed by me and considered in my medical decision making (see chart for details).    MDM Rules/Calculators/A&P                          Patient to ED with ss/sxs allergies and bilateral ear pain. No fever.   There is some redness involving the TM's bilaterally. Without fever, doubt bacterial infection. Likely related to allergic symptoms, seasonal. Will recommend medications and encourage PCP follow up.   Final Clinical Impression(s) / ED Diagnoses Final diagnoses:  None   1. Seasonal allergies 2. Ear pain  Rx /  DC Orders ED Discharge Orders    None       Danne Harbor 05/26/20 0128    Dione Booze, MD 05/26/20 0425

## 2020-05-26 NOTE — ED Triage Notes (Signed)
Approx 2 hours of rt ear pain then developed pain in left ear too.

## 2020-05-26 NOTE — Discharge Instructions (Signed)
Take Claritin-D twice daily as recommended. This is an over-the-counter medication that can be continued as long as you have symptoms. Recommend tylenol and/or ibuprofen for additional relief of discomfort.

## 2020-05-28 ENCOUNTER — Emergency Department (HOSPITAL_COMMUNITY)
Admission: EM | Admit: 2020-05-28 | Discharge: 2020-05-28 | Disposition: A | Discharge status: Home / Self Care | Payer: Medicaid Other | Attending: Emergency Medicine | Admitting: Emergency Medicine

## 2020-05-28 ENCOUNTER — Encounter (HOSPITAL_COMMUNITY): Payer: Self-pay | Admitting: Emergency Medicine

## 2020-05-28 ENCOUNTER — Other Ambulatory Visit: Payer: Self-pay

## 2020-05-28 DIAGNOSIS — H9203 Otalgia, bilateral: Secondary | ICD-10-CM | POA: Insufficient documentation

## 2020-05-28 DIAGNOSIS — Z5321 Procedure and treatment not carried out due to patient leaving prior to being seen by health care provider: Secondary | ICD-10-CM | POA: Insufficient documentation

## 2020-05-28 DIAGNOSIS — R112 Nausea with vomiting, unspecified: Secondary | ICD-10-CM | POA: Insufficient documentation

## 2020-05-28 NOTE — ED Triage Notes (Signed)
Patient here from home reporting bilateral ear pain and n/v that started today. Seen 11/17 for same.

## 2020-07-05 ENCOUNTER — Encounter (HOSPITAL_COMMUNITY): Payer: Self-pay | Admitting: Family Medicine

## 2020-07-05 ENCOUNTER — Other Ambulatory Visit: Payer: Self-pay

## 2020-07-05 ENCOUNTER — Inpatient Hospital Stay (HOSPITAL_COMMUNITY)
Admission: AD | Admit: 2020-07-05 | Discharge: 2020-07-05 | Disposition: A | Discharge status: Home / Self Care | Payer: Medicaid Other | Attending: Family Medicine | Admitting: Family Medicine

## 2020-07-05 DIAGNOSIS — Z3201 Encounter for pregnancy test, result positive: Secondary | ICD-10-CM

## 2020-07-05 NOTE — MAU Provider Note (Signed)
Event Date/Time   First Provider Initiated Contact with Patient 07/05/20 1617      S Ms. Dawn Moody is a 24 y.o. G1P0 patient who presents to MAU today requesting pregnancy confirmation in the setting of positive home pregnancy test. She denies all complaints including abdominal pain, back pain, vaginal bleeding, dysuria, fever.  O BP 134/73 (BP Location: Right Arm)    Pulse 78    Temp 98.8 F (37.1 C) (Oral)    Resp 18    Ht 5\' 9"  (1.753 m)    Wt 100.3 kg    LMP 05/24/2020    SpO2 100% Comment: room air   BMI 32.65 kg/m    Physical Exam Vitals and nursing note reviewed. Exam conducted with a chaperone present.  Constitutional:      General: She is not in acute distress.    Appearance: Normal appearance. She is not ill-appearing.  Cardiovascular:     Rate and Rhythm: Normal rate.     Pulses: Normal pulses.     Heart sounds: Normal heart sounds.  Pulmonary:     Effort: Pulmonary effort is normal.     Breath sounds: Normal breath sounds.  Skin:    Capillary Refill: Capillary refill takes less than 2 seconds.  Neurological:     Mental Status: She is alert and oriented to person, place, and time.     A Medical screening exam complete No complaints No acute findings  P Discharge from MAU in stable condition List of options for follow-up given including availability of walk-in pregnancy test at any [facility] office Warning signs for worsening condition that would warrant emergency follow-up discussed Patient may return to MAU as needed   [facility], Clayton Bibles 07/05/2020 5:36 PM

## 2020-07-05 NOTE — MAU Note (Signed)
Has positive HPT at home and needs proof of pregnancy for DSS. Denies bleeding, cramping or pain.

## 2020-07-05 NOTE — Discharge Instructions (Signed)
Prenatal Care Providers           [facility]  [address] [phone]  [facility]   [address]  [phone]  [facility] [place]       [address] [phone]            [facility] @ [place]     [address] [phone]          [facility] [place]   [address] [phone]  [facility]  [address] [phone]     [facility]  [address]   [phone]     [facility]  Phone: [phone]  [facility]  Phone: [phone]  [facility] Phone: [phone]  [facility] Phone: [phone]  [facility] Phone: [phone]  [facility] Phone: [phone]  [facility]  Phone: [phone]  

## 2020-07-06 ENCOUNTER — Encounter: Payer: Self-pay | Admitting: Family Medicine

## 2020-07-06 ENCOUNTER — Ambulatory Visit (INDEPENDENT_AMBULATORY_CARE_PROVIDER_SITE_OTHER): Payer: Self-pay

## 2020-07-06 VITALS — BP 117/78 | HR 70 | Wt 227.5 lb

## 2020-07-06 DIAGNOSIS — Z3201 Encounter for pregnancy test, result positive: Secondary | ICD-10-CM

## 2020-07-06 LAB — POCT PREGNANCY, URINE: Preg Test, Ur: POSITIVE — AB

## 2020-07-06 NOTE — Progress Notes (Signed)
Pt here today for pregnancy test resulting positive.  Pt denies vaginal bleeding but reports mild menstrual like cramps across the lower abdomen.  Pt advised that if she starts to bleed like a period or her pain becomes one-sided or more intense to please go to MAU.  Reviewed allergies/medications.  Pt reports LMP 05/24/20 making pt 6w 1d today, and EDD 02/28/21.  Pt encouraged to start taking PNV and to start prenatal care. Front office to provide pt with proof of pregnancy letter. Pt verbalized understanding with no further concerns.    Addison Naegeli, RN  07/06/20

## 2020-07-10 NOTE — L&D Delivery Note (Addendum)
LABOR COURSE Dawn Moody is a 25yo female who presented at [redacted]w[redacted]d for IOL due to elevated BPs. Her labor was augmented with cytotec x1 and pitocin for 5.5 hours. Her pain was controlled with IV fentanyl and an epidural. Fetal heart tones remained Category 1 throughout labor. Blood pressures were intermittently elevated to the 130s-150s/80s-100s.   Delivery Note Called to room and patient was complete and pushing. Head delivered LOA. No nuchal cord present. Shoulder and body delivered in usual fashion. At  1755 a healthy female was delivered with spontaneous cry, placed on mother's abdomen, dried and stimulated. Cord clamped x 2 after 1-minute delay, and cut by FOB. Cord blood drawn. Placenta delivered spontaneously with gentle cord traction. Appears intact. Fundus firm with massage and Pitocin. Labia, perineum, vagina, and cervix inspected with bilateral periurethral tears and a second-degree perineal. A urethral catheter was placed prior to repair and was removed after repair without issue. Repaired right periurethral tear, left was hemostatic.    APGAR: 8, 9; weight  pending.   Cord: 3VC with the following complications: none.    Anesthesia:  Epidural Episiotomy: None Lacerations: bilateral periurethral, second-degree perineal Suture Repair: vicryl (3.0 for perineal, 4.0 for peri-urethral)  Est. Blood Loss (mL): 150  Mom to postpartum.  Baby to Couplet care / Skin to Skin.  Alicia Amel, MD 02/22/21 6:36 PM    GME ATTESTATION:  I was gloved for the entire delivery and assisted with delivery of fetal head. I repaired the second degree perineal with good approximation and hemostasis. Delivery call was made due to moderate mec, however left upon delivery after spontaneous cry/APGAR 8/9.  I agree with the findings and the plan of care as documented in the resident's note.  Leticia Penna, DO OB Fellow, Faculty [facility], [facility] 02/22/2021 7:17 PM

## 2020-08-30 ENCOUNTER — Other Ambulatory Visit (HOSPITAL_COMMUNITY)
Admission: RE | Admit: 2020-08-30 | Discharge: 2020-08-30 | Disposition: A | Discharge status: Home / Self Care | Payer: Medicaid Other | Source: Ambulatory Visit | Attending: Nurse Practitioner | Admitting: Nurse Practitioner

## 2020-08-30 ENCOUNTER — Ambulatory Visit (INDEPENDENT_AMBULATORY_CARE_PROVIDER_SITE_OTHER): Payer: Self-pay | Admitting: Nurse Practitioner

## 2020-08-30 ENCOUNTER — Other Ambulatory Visit: Payer: Self-pay

## 2020-08-30 ENCOUNTER — Encounter: Payer: Self-pay | Admitting: Nurse Practitioner

## 2020-08-30 VITALS — BP 132/78 | HR 89 | Wt 227.2 lb

## 2020-08-30 DIAGNOSIS — Z3402 Encounter for supervision of normal first pregnancy, second trimester: Secondary | ICD-10-CM | POA: Insufficient documentation

## 2020-08-30 DIAGNOSIS — Z803 Family history of malignant neoplasm of breast: Secondary | ICD-10-CM

## 2020-08-30 DIAGNOSIS — O0990 Supervision of high risk pregnancy, unspecified, unspecified trimester: Secondary | ICD-10-CM | POA: Insufficient documentation

## 2020-08-30 DIAGNOSIS — Z3A14 14 weeks gestation of pregnancy: Secondary | ICD-10-CM

## 2020-08-30 DIAGNOSIS — O09899 Supervision of other high risk pregnancies, unspecified trimester: Secondary | ICD-10-CM

## 2020-08-30 DIAGNOSIS — O99891 Other specified diseases and conditions complicating pregnancy: Secondary | ICD-10-CM

## 2020-08-30 DIAGNOSIS — Z3400 Encounter for supervision of normal first pregnancy, unspecified trimester: Secondary | ICD-10-CM | POA: Insufficient documentation

## 2020-08-30 DIAGNOSIS — Z283 Underimmunization status: Secondary | ICD-10-CM

## 2020-08-30 LAB — POCT URINALYSIS DIP (DEVICE)
Bilirubin Urine: NEGATIVE
Glucose, UA: NEGATIVE mg/dL
Hgb urine dipstick: NEGATIVE
Ketones, ur: NEGATIVE mg/dL
Nitrite: NEGATIVE
Protein, ur: NEGATIVE mg/dL
Specific Gravity, Urine: 1.02 (ref 1.005–1.030)
Urobilinogen, UA: 0.2 mg/dL (ref 0.0–1.0)
pH: 7.5 (ref 5.0–8.0)

## 2020-08-30 MED ORDER — PRENATAL PLUS 27-1 MG PO TABS: 1.0000 | ORAL_TABLET | Freq: Every day | ORAL | 11 refills | Status: DC

## 2020-08-30 NOTE — Progress Notes (Signed)
Subjective:   Dawn Moody is a 25 y.o. G2P0010 at [redacted]w[redacted]d by LMP being seen today for her first obstetrical visit.  Her obstetrical history is significant for no problems identified. Patient does intend to breast feed. Pregnancy history fully reviewed.  Patient reports no complaints.  HISTORY: OB History  Gravida Para Term Preterm AB Living  2 0 0 0 1 0  SAB IAB Ectopic Multiple Live Births  1 0 0 0 0    # Outcome Date GA Lbr Len/2nd Weight Sex Delivery Anes PTL Lv  2 Current           1 SAB            Past Medical History:  Diagnosis Date  . Arthritis   . Obesity    Past Surgical History:  Procedure Laterality Date  . TYMPANOSTOMY TUBE PLACEMENT     Family History  Problem Relation Age of Onset  . Hypertension Mother   . Breast cancer Mother   . Hypertension Father   . Diabetes Father   . Cancer Father    Social History   Tobacco Use  . Smoking status: Current Every Day Smoker    Packs/day: 2.00    Types: Cigarettes  . Smokeless tobacco: Current User  Vaping Use  . Vaping Use: Former  Substance Use Topics  . Alcohol use: Yes    Comment: occ  . Drug use: No   Allergies  Allergen Reactions  . Amoxicillin Anaphylaxis    Has patient had a PCN reaction causing immediate rash, facial/tongue/throat swelling, SOB or lightheadedness with hypotension: Yes Has patient had a PCN reaction causing severe rash involving mucus membranes or skin necrosis: No Has patient had a PCN reaction that required hospitalization: Yes Has patient had a PCN reaction occurring within the last 10 years: No If all of the above answers are "NO", then may proceed with Cephalosporin use.    No current outpatient medications on file prior to visit.   No current facility-administered medications on file prior to visit.     Exam   Vitals:   08/30/20 1420  BP: 132/78  Pulse: 89  Weight: 227 lb 3.2 oz (103.1 kg)   Fetal Heart Rate (bpm): 155  Uterus:     Pelvic Exam:  Perineum: no hemorrhoids, normal perineum   Vulva: normal external genitalia, no lesions   Vagina:  normal mucosa, normal discharge   Cervix: no lesions and normal, pap smear done.    Adnexa: normal adnexa and no mass, fullness, tenderness   Bony Pelvis: average  System: General: well-developed, well-nourished female in no acute distress   Breast:  normal appearance, no masses or tenderness   Skin: normal coloration and turgor, no rashes   Neurologic: oriented, normal, negative, normal mood   Extremities: normal strength, tone, and muscle mass, ROM of all joints is normal   HEENT extraocular movement intact and sclera clear, anicteric   Mouth/Teeth deferred   Neck supple and no masses, normal thyroid   Cardiovascular: regular rate and rhythm   Respiratory:  no respiratory distress, normal breath sounds   Abdomen: soft, non-tender; no masses,  no organomegaly     Assessment:   Pregnancy: G2P0010 Patient Active Problem List   Diagnosis Date Noted  . Supervision of low-risk first pregnancy 08/30/2020     Plan:  1. Encounter for supervision of low-risk first pregnancy in second trimester Reviewed MyChart and Babyscripts Shown how to use BP cuff by RN Excited  to be pregnant  - prenatal vitamin w/FE, FA (PRENATAL 1 + 1) 27-1 MG TABS tablet; Take 1 tablet by mouth daily at 12 noon.  Dispense: 30 tablet; Refill: 11 - CBC/D/Plt+RPR+Rh+ABO+Rub Ab... - Genetic Screening - [facility] AMB BABYSCRIPTS SCHEDULE OPTIMIZATION - [place] MFM OB COMP + 14 WK; Future - Cytology - PAP( [place]) - Culture, OB Urine  2. [redacted] weeks gestation of pregnancy    Initial labs drawn. Continue prenatal vitamins. Genetic Screening discussed, NIPS: ordered. Ultrasound discussed; fetal anatomic survey: ordered. Problem list reviewed and updated. The nature of [place] [facility] with multiple MDs and other Advanced Practice Providers was explained to patient; also emphasized that  residents, students are part of our team. Routine obstetric precautions reviewed. Return in about 4 weeks (around 09/27/2020) for in person ROB.  Total face-to-face time with patient: 40 minutes.  Over 50% of encounter was spent on counseling and coordination of care.     Nolene Bernheim, FNP Family Nurse Practitioner, [facility] for [employer], [facility] 08/30/2020 7:57 PM

## 2020-08-30 NOTE — Patient Instructions (Signed)
Second Trimester of Pregnancy  The second trimester of pregnancy is from week 13 through week 27. This is also called months 4 through 6 of pregnancy. This is often the time when you feel your best. During the second trimester:  Morning sickness is less or has stopped.  You may have more energy.  You may feel hungry more often. At this time, your unborn baby (fetus) is growing very fast. At the end of the sixth month, the unborn baby may be up to 12 inches long and weigh about 1 pounds. You will likely start to feel the baby move between 16 and 20 weeks of pregnancy. Body changes during your second trimester Your body continues to go through many changes during this time. The changes vary and generally return to normal after the baby is born. Physical changes  You will gain more weight.  You may start to get stretch marks on your hips, belly (abdomen), and breasts.  Your breasts will grow and may hurt.  Dark spots or blotches may develop on your face.  A dark line from your belly button to the pubic area (linea nigra) may appear.  You may have changes in your hair. Health changes  You may have headaches.  You may have heartburn.  You may have trouble pooping (constipation).  You may have hemorrhoids or swollen, bulging veins (varicose veins).  Your gums may bleed.  You may pee (urinate) more often.  You may have back pain. Follow these instructions at home: Medicines  Take over-the-counter and prescription medicines only as told by your doctor. Some medicines are not safe during pregnancy.  Take a prenatal vitamin that contains at least 600 micrograms (mcg) of folic acid. Eating and drinking  Eat healthy meals that include: ? Fresh fruits and vegetables. ? Whole grains. ? Good sources of protein, such as meat, eggs, or tofu. ? Low-fat dairy products.  Avoid raw meat and unpasteurized juice, milk, and cheese.  You may need to take these actions to prevent or  treat trouble pooping: ? Drink enough fluids to keep your pee (urine) pale yellow. ? Eat foods that are high in fiber. These include beans, whole grains, and fresh fruits and vegetables. ? Limit foods that are high in fat and sugar. These include fried or sweet foods. Activity  Exercise only as told by your doctor. Most people can do their usual exercise during pregnancy. Try to exercise for 30 minutes at least 5 days a week.  Stop exercising if you have pain or cramps in your belly or lower back.  Do not exercise if it is too hot or too humid, or if you are in a place of great height (high altitude).  Avoid heavy lifting.  If you choose to, you may have sex unless your doctor tells you not to. Relieving pain and discomfort  Wear a good support bra if your breasts are sore.  Take warm water baths (sitz baths) to soothe pain or discomfort caused by hemorrhoids. Use hemorrhoid cream if your doctor approves.  Rest with your legs raised (elevated) if you have leg cramps or low back pain.  If you develop bulging veins in your legs: ? Wear support hose as told by your doctor. ? Raise your feet for 15 minutes, 3-4 times a day. ? Limit salt in your food. Safety  Wear your seat belt at all times when you are in a car.  Talk with your doctor if someone is hurting you or yelling  at you a lot. Lifestyle  Do not use hot tubs, steam rooms, or saunas.  Do not douche. Do not use tampons or scented sanitary pads.  Avoid cat litter boxes and soil used by cats. These carry germs that can harm your baby and can cause a loss of your baby by miscarriage or stillbirth.  Do not use herbal medicines, illegal drugs, or medicines that are not approved by your doctor. Do not drink alcohol.  Do not smoke or use any products that contain nicotine or tobacco. If you need help quitting, ask your doctor. General instructions  Keep all follow-up visits. This is important.  Ask your doctor about local  prenatal classes.  Ask your doctor about the right foods to eat or for help finding a counselor. Where to find more information  American Pregnancy Association: americanpregnancy.org  [employer] of Obstetricians and Gynecologists: www.acog.org  Office on [employer] [facility]: MightyReward.co.nz Contact a doctor if:  You have a headache that does not go away when you take medicine.  You have changes in how you see, or you see spots in front of your eyes.  You have mild cramps, pressure, or pain in your lower belly.  You continue to feel like you may vomit (nauseous), you vomit, or you have watery poop (diarrhea).  You have bad-smelling fluid coming from your vagina.  You have pain when you pee or your pee smells bad.  You have very bad swelling of your face, hands, ankles, feet, or legs.  You have a fever. Get help right away if:  You are leaking fluid from your vagina.  You have spotting or bleeding from your vagina.  You have very bad belly cramping or pain.  You have trouble breathing.  You have chest pain.  You faint.  You have not felt your baby move for the time period told by your doctor.  You have new or increased pain, swelling, or redness in an arm or leg. Summary  The second trimester of pregnancy is from week 13 through week 27 (months 4 through 6).  Eat healthy meals.  Exercise as told by your doctor. Most people can do their usual exercise during pregnancy.  Do not use herbal medicines, illegal drugs, or medicines that are not approved by your doctor. Do not drink alcohol.  Call your doctor if you get sick or if you notice anything unusual about your pregnancy. This information is not intended to replace advice given to you by your health care provider. Make sure you discuss any questions you have with your health care provider. Document Revised: 12/03/2019 Document Reviewed: 10/09/2019 Elsevier Patient Education  2021 Elsevier  Inc.  Eating Plan for Pregnant Women While you are pregnant, your body requires additional nutrition to help support your growing baby. You also have a higher need for some vitamins and minerals, such as folic acid, calcium, iron, and vitamin D. Eating a healthy, well-balanced diet is very important for your health and your baby's health. Your need for extra calories varies over the course of your pregnancy. Pregnancy is divided into three trimesters, with each trimester lasting 3 months. For most women, it is recommended to consume:  150 extra calories a day during the first trimester.  300 extra calories a day during the second trimester.  300 extra calories a day during the third trimester. What are tips for following this plan? Cooking  Practice good food safety and cleanliness. Wash your hands before you eat and after you prepare  raw meat. Wash all fruits and vegetables well before peeling or eating. Taking these actions can help to prevent foodborne illnesses that can be very dangerous to your baby, such as listeriosis. Ask your health care provider for more information about listeriosis.  Make sure that all meats, poultry, and eggs are cooked to food-safe temperatures or "well-done." Meal planning  Eat a variety of foods (especially fruits and vegetables) to get a full range of vitamins and minerals.  Two or more servings of fish are recommended each week in order to get the most benefits from omega-3 fatty acids that are found in seafood. Choose fish that are lower in mercury, such as salmon and pollock.  Limit your overall intake of foods that have "empty calories." These are foods that have little nutritional value, such as sweets, desserts, candies, and sugar-sweetened beverages.  Drinks that contain caffeine are okay to drink, but it is better to avoid caffeine. Keep your total caffeine intake to less than 200 mg each day (which is 12 oz or 355 mL of coffee, tea, or soda) or the  limit as told by your health care provider.   General information  Do not try to lose weight or go on a diet during pregnancy.  Take a prenatal vitamin to help meet your additional vitamin and mineral needs during pregnancy, specifically for folic acid, iron, calcium, and vitamin D.  Remember to stay active. Ask your health care provider what types of exercise and activities are safe for you. What does 150 extra calories look like? Healthy options that provide 150 extra calories each day could be any of the following:  6-8 oz (170-227 g) plain low-fat yogurt with  cup (70 g) berries.  1 apple with 2 tsp (11 g) peanut butter.  Cut-up vegetables with  cup (60 g) hummus.  8 fl oz (237 mL) low-fat chocolate milk.  1 stick of string cheese with 1 medium orange.  1 peanut butter and jelly sandwich that is made with one slice of whole-wheat bread and 1 tsp (5 g) of peanut butter. For 300 extra calories, you could eat two of these healthy options each day. What is a healthy amount of weight to gain? The right amount of weight gain for you is based on your BMI (body mass index) before you became pregnant.  If your BMI was less than 18 (underweight), you should gain 28-40 lb (13-18 kg).  If your BMI was 18-24.9 (normal), you should gain 25-35 lb (11-16 kg).  If your BMI was 25-29.9 (overweight), you should gain 15-25 lb (7-11 kg).  If your BMI was 30 or greater (obese), you should gain 11-20 lb (5-9 kg). What if I am having twins or multiples? Generally, if you are carrying twins or multiples:  You may need to eat 300-600 extra calories a day.  The recommended range for total weight gain is 25-54 lb (11-25 kg), depending on your BMI before pregnancy.  Talk with your health care provider to find out about nutritional needs, weight gain, and exercise that is right for you. What foods should I eat? Fruits All fruits. Eat a variety of colors and types of fruit. Remember to wash your  fruits well before peeling or eating. Vegetables All vegetables. Eat a variety of colors and types of vegetables. Remember to wash your vegetables well before peeling or eating. Grains All grains. Choose whole grains, such as whole-wheat bread, oatmeal, or brown rice. Meats and other protein foods Lean meats, including  chicken, [place], and lean cuts of beef, veal, or pork. Fish that is higher in omega-3 fatty acids and lower in mercury, such as salmon, herring, mussels, trout, sardines, pollock, shrimp, crab, and lobster. Tofu. Tempeh. Beans. Eggs. Peanut butter and other nut butters. Dairy Pasteurized milk and milk alternatives, such as almond milk. Pasteurized yogurt and pasteurized cheese. Cottage cheese. Sour cream. Beverages Water. Juices that contain 100% fruit juice or vegetable juice. Caffeine-free teas and decaffeinated coffee. Fats and oils Fats and oils are okay to include in moderation. Sweets and desserts Sweets and desserts are okay to include in moderation. Seasoning and other foods All pasteurized condiments. The items listed above may not be a complete list of foods and beverages you can eat. Contact a dietitian for more information.   What foods should I avoid? Fruits Raw (unpasteurized) fruit juices. Vegetables Unpasteurized vegetable juices. Meats and other protein foods Precooked or cured meat, such as bologna, hot dogs, sausages, or meat loaves. (If you must eat those meats, reheat them until they are steaming hot.) Refrigerated pate, meat spreads from a meat counter, or smoked seafood that is found in the refrigerated section of a store. Raw or undercooked meats, poultry, and eggs. Raw fish, such as sushi or sashimi. Fish that have high mercury content, such as tilefish, shark, swordfish, and king mackerel. Dairy Unpasteurized milk and any foods that have unpasteurized milk in them. Soft cheeses, such as feta, queso blanco, queso fresco, [place], [place], panela, and  blue-veined cheeses (unless they are made with pasteurized milk, which must be stated on the label). Beverages Alcohol. Sugar-sweetened beverages, such as sodas, teas, or energy drinks. Seasoning and other foods Homemade fermented foods and drinks, such as pickles, sauerkraut, or kombucha drinks. (Store-bought pasteurized versions of these are okay.) Salads that are made in a store or deli, such as ham salad, chicken salad, egg salad, tuna salad, and seafood salad. The items listed above may not be a complete list of foods and beverages you should avoid. Contact a dietitian for more information. Where to find more information To calculate the number of calories you need based on your height, weight, and activity level, you can use an online calculator such as:  PayStrike.dk To calculate how much weight you should gain during pregnancy, you can use an online pregnancy weight gain calculator such as:  http://www.harvey.com/ To learn more about eating fish during pregnancy, talk with your health care provider or visit:  PumpkinSearch.com.ee Summary  While you are pregnant, your body requires additional nutrition to help support your growing baby.  Eat a variety of foods, especially fruits and vegetables, to get a full range of vitamins and minerals.  Practice good food safety and cleanliness. Wash your hands before you eat and after you prepare raw meat. Wash all fruits and vegetables well before peeling or eating. Taking these actions can help to prevent foodborne illnesses, such as listeriosis, that can be very dangerous to your baby.  Do not eat raw meat or fish. Do not eat fish that have high mercury content, such as tilefish, shark, swordfish, and king mackerel. Do not eat raw (unpasteurized) dairy.  Take a prenatal vitamin to help meet your additional vitamin and mineral needs during pregnancy, specifically for folic acid, iron, calcium, and vitamin D. This information is not intended  to replace advice given to you by your health care provider. Make sure you discuss any questions you have with your health care provider. Document Revised: 01/22/2020 Document Reviewed: 01/22/2020 Elsevier  Elsevier Patient Education  2021 Elsevier Inc.   

## 2020-08-31 ENCOUNTER — Telehealth: Payer: Self-pay | Admitting: Family Medicine

## 2020-08-31 LAB — CBC/D/PLT+RPR+RH+ABO+RUB AB...
Antibody Screen: NEGATIVE
Basophils Absolute: 0 10*3/uL (ref 0.0–0.2)
Basos: 1 %
EOS (ABSOLUTE): 0 10*3/uL (ref 0.0–0.4)
Eos: 1 %
HCV Ab: <0.1 s/co ratio (ref 0.0–0.9)
HIV Screen 4th Generation wRfx: NONREACTIVE
Hematocrit: 37.4 % (ref 34.0–46.6)
Hemoglobin: 12.5 g/dL (ref 11.1–15.9)
Hepatitis B Surface Ag: NEGATIVE
Immature Grans (Abs): 0 10*3/uL (ref 0.0–0.1)
Immature Granulocytes: 0 %
Lymphocytes Absolute: 1.6 10*3/uL (ref 0.7–3.1)
Lymphs: 33 %
MCH: 31.5 pg (ref 26.6–33.0)
MCHC: 33.4 g/dL (ref 31.5–35.7)
MCV: 94 fL (ref 79–97)
Monocytes Absolute: 0.4 10*3/uL (ref 0.1–0.9)
Monocytes: 7 %
Neutrophils Absolute: 2.8 10*3/uL (ref 1.4–7.0)
Neutrophils: 58 %
Platelets: 176 10*3/uL (ref 150–450)
RBC: 3.97 x10E6/uL (ref 3.77–5.28)
RDW: 12.1 % (ref 11.7–15.4)
RPR Ser Ql: REACTIVE — AB
Rh Factor: POSITIVE
Rubella Antibodies, IGG: <0.9 index — ABNORMAL LOW (ref >0.99)
WBC: 4.8 10*3/uL (ref 3.4–10.8)

## 2020-08-31 LAB — RPR, QUANT+TP ABS (REFLEX)
Rapid Plasma Reagin, Quant: 1:4 {titer} — ABNORMAL HIGH
T Pallidum Abs: REACTIVE — AB

## 2020-08-31 LAB — HCV INTERPRETATION

## 2020-08-31 NOTE — Telephone Encounter (Signed)
Caller states  she was told to call office if her BP was high.  Pt states it is currently 170/90 hr-118. Pt sates she is not experiencing any symptoms. Please give pt a call back thanks

## 2020-09-01 ENCOUNTER — Encounter: Payer: Medicaid Other | Admitting: Obstetrics and Gynecology

## 2020-09-01 DIAGNOSIS — O99891 Other specified diseases and conditions complicating pregnancy: Secondary | ICD-10-CM | POA: Insufficient documentation

## 2020-09-01 DIAGNOSIS — Z2839 Other underimmunization status: Secondary | ICD-10-CM | POA: Insufficient documentation

## 2020-09-01 LAB — URINE CULTURE, OB REFLEX

## 2020-09-01 LAB — CULTURE, OB URINE

## 2020-09-01 NOTE — Telephone Encounter (Addendum)
I called Dawn Moody regarding her report of elevated BP. She did not answer. I left VM message stating that I am calling with test results and to discuss her BP. I asked Dawn Moody to call back and leave message on nurse voicemail as soon as she receives my message so that I can call her back.  Also, per note from Nolene Bernheim, Dawn Moody needs treatment for Syphilis and consult with MD. I spoke with Dr. Vergie Living who advises that Dawn Moody should receive treatment @ [facility] in order for more complete follow up. Dawn Moody will still need to be seen in office for BP check ASAP.   1550  Spoke w/Dawn Moody and discussed her report of elevated BP from yesterday. She denies having H/A or visual disturbances @ the time. She has not checked her BP again since yesterday. Dawn Moody was advised that we would like to check her BP in the office tomorrow and for her to bring her cuff to the appointment for comparison. She was also informed of +Syphilis test result. She and her partner will require treatment. This will be discussed further during her office visit. Dawn Moody agrees to be seen in office tomorrow. I advised that I will schedule the appointment and then call her back with the details. She stated that a detailed message can be left on her voicemail if she does not answer. Dawn Moody voiced understanding of all information and instructions given.   1602  Called Dawn Moody and left message stating that we have scheduled appointment for tomorrow (2/24) @ 1:30 pm.   1620  I called and spoke w/Cherae Deneen Harts [phone] @ [facility] to schedule appt for Dawn Moody. 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 Per chart review, Dawn Moody denied Hx of STI during information gathering @ visit on 08/30/20. With this information, it is possible that Dawn Moody's current titer of 1:4 is not a new infection. Cherae will fax information to [place] in preparation for Dawn Moody's appt tomorrow. I called and spoke w/Dawn Moody  and let her know that she may not have a new infection. Her treatment plan will be determined tomorrow @ her visit once the doctor has reviewed the records from [facility]. Dawn Moody voiced understanding.

## 2020-09-01 NOTE — Progress Notes (Addendum)
Please call patient to come in.  She needs a BP check (see telephone note in chart) and she needs treatment for syphilis.  Titers are 1:4.  Consult with MD today to verify stage of syphilis and treatment indicated. She also needs CBC, CMET and Protein creatinine ratio.

## 2020-09-02 ENCOUNTER — Other Ambulatory Visit: Payer: Self-pay

## 2020-09-02 ENCOUNTER — Ambulatory Visit (INDEPENDENT_AMBULATORY_CARE_PROVIDER_SITE_OTHER): Payer: Self-pay | Admitting: Family Medicine

## 2020-09-02 VITALS — BP 118/79 | HR 75 | Wt 222.0 lb

## 2020-09-02 DIAGNOSIS — O09899 Supervision of other high risk pregnancies, unspecified trimester: Secondary | ICD-10-CM

## 2020-09-02 DIAGNOSIS — Z8619 Personal history of other infectious and parasitic diseases: Secondary | ICD-10-CM

## 2020-09-02 DIAGNOSIS — Z2839 Other underimmunization status: Secondary | ICD-10-CM

## 2020-09-02 DIAGNOSIS — Z283 Underimmunization status: Secondary | ICD-10-CM

## 2020-09-02 DIAGNOSIS — O99891 Other specified diseases and conditions complicating pregnancy: Secondary | ICD-10-CM

## 2020-09-02 DIAGNOSIS — Z3402 Encounter for supervision of normal first pregnancy, second trimester: Secondary | ICD-10-CM

## 2020-09-02 HISTORY — DX: Personal history of other infectious and parasitic diseases: Z86.19

## 2020-09-02 LAB — CYTOLOGY - PAP
Chlamydia: NEGATIVE
Comment: NEGATIVE
Comment: NEGATIVE
Comment: NORMAL
Diagnosis: NEGATIVE
Diagnosis: REACTIVE
Neisseria Gonorrhea: NEGATIVE
Trichomonas: NEGATIVE

## 2020-09-02 NOTE — Progress Notes (Signed)
   PRENATAL VISIT NOTE  Subjective:  Dawn Moody is a 25 y.o. G2P0010 at 87w3dbeing seen today for ongoing prenatal care.  She is currently monitored for the following issues for this low-risk pregnancy and has Supervision of low-risk first pregnancy; Family history of breast cancer in mother; Rubella non-immune status, antepartum; and History of syphilis on their problem list.  Patient reports no complaints.  Contractions: Not present. Vag. Bleeding: None.  Movement: Present. Denies leaking of fluid.   Patient reporting to clinic today for +RPR titer on routine screening. Asymptomatic.   The following portions of the patient's history were reviewed and updated as appropriate: allergies, current medications, past family history, past medical history, past social history, past surgical history and problem list.   Objective:   Vitals:   09/02/20 1353  BP: 118/79  Pulse: 75  Weight: 222 lb (100.7 kg)    Fetal Status: Fetal Heart Rate (bpm): 154   Movement: Present     General:  Alert, oriented and cooperative. Patient is in no acute distress.  Skin: Skin is warm and dry. No rash noted.   Cardiovascular: Normal heart rate noted  Respiratory: Normal respiratory effort, no problems with respiration noted  Abdomen: Soft, gravid, appropriate for gestational age.  Pain/Pressure: Present     Pelvic: Cervical exam deferred        Extremities: Normal range of motion.  Edema: None  Mental Status: Normal mood and affect. Normal behavior. Normal judgment and thought content.   Assessment and Plan:  Pregnancy: G2P0010 at 130w3d. History of syphilis Asymptomatic. No history of lesions, diagnosed on routine lab work while incarcerated. 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 Partner is in process of being tested as well. Patient will report to [facility] for direct admit to antepartum 09/03/20.  2. Supervision of pregnancy -taking PNV -instructed to start bASA _1   3. Rubella NI -MMR postpartum  Preterm labor symptoms and general obstetric precautions including but not limited to vaginal bleeding, contractions, leaking of fluid and fetal movement were reviewed in detail with the patient. Please refer to After Visit Summary for other counseling recommendations.   No follow-ups on file.  Future Appointments  Date Time Provider [place]3/02/2021  1:00 PM [facility] US1 [facility]/21/2022  8:35 AM [place], TeRona RavensNP [facility]  AlArrie SenateMD

## 2020-09-02 NOTE — Patient Instructions (Signed)
-report to Maternal Assessment Unit at [facility] and [facility] 09/03/20 @0830  for direct admit to antepartum for penicillin treatment and desensitization   Syphilis Syphilis is an infection that can spread through sexual contact. The infection can cause serious complications, so it is important to get treatment right away. There are four stages of syphilis:  Primary stage. During this stage sores may form where the disease entered your body.  Secondary stage. During this stage skin rashes and lesions will form.  Latent stage. During this stage there are no symptoms, but the infection may still be contagious.  Tertiary stage. This stage happens 10-30 years after the infection starts. During this stage, the disease damages organs and can lead to death. Most people do not develop this stage of syphilis. What are the causes? This condition is caused by bacteria called Treponema pallidum. The condition can spread during sexual activity, such as during oral, anal, or vaginal sex. It can also be spread from mother to fetus during pregnancy. What increases the risk? You are more likely to develop this condition if:  You do not use a condom.  You have or had another sexually transmitted infection (STI).  You have multiple sex partners.  You use illegal drugs through an IV. What are the signs or symptoms? Symptoms of this condition depend on the stage of the disease. Primary stage  Painless sores (chancres) in and around the genital organs, mouth, or hands. The sores are usually firm and round. Secondary stage  A rash or sores. The rash usually does not itch.  A fever.  A headache.  A sore throat.  Swollen lymph nodes.  New sores in the mouth or on the genitals.  A feeling of being ill.  Pain in the joints.  Patchy hair loss.  Weight loss.  Fatigue. Latent stage There are no symptoms during this stage. Tertiary stage  Dementia.  Personality and mood  changes.  Difficulty walking and coordinating movements.  Muscle weakness or paralysis.  Problems with coordination.  Heart failure.  Trouble breathing.  Fainting.  Soft, rubbery growths on the skin, bones, or liver (gummas).  Sudden "lightning" pains, numbness, or tingling.  Vision changes.  Hearing changes.  Trouble controlling your urine and bowel movements. How is this diagnosed? This condition is diagnosed with:  A physical exam.  Blood tests.  Tests of the fluid (drainage) from a sore or rash.  Tests of the fluid around the spine (lumbar puncture). These tests are done to check for an infection in the brain or nervous system.  Imaging tests. These may be done to check for damage to the heart, aorta, or brain if the condition is in the tertiary stage. Tests may include: ? An X-ray. ? A CT scan. ? An MRI. ? An echocardiogram. This test takes a picture of the heart. ? An ultrasound.   How is this treated? This condition can be cured with antibiotic medicine. During the first day of treatment, the medicine may cause you to experience fever, chills, headache, nausea, or aching all over your body. This is normal and should go away within 24 hours. Follow these instructions at home: Medicines  Take over-the-counter and prescription medicines only as told by your health care provider.  Take your antibiotic medicine as told by your health care provider. Do not stop taking the antibiotic even if you start to feel better. Incomplete treatment will put you at risk for continued infection and could be life threatening.   General  instructions  Do not have sex until your treatment is completed, or as directed by your health care provider.  Tell your recent sexual partners that you were diagnosed with syphilis. It is important that they get treatment, even if they do not have symptoms.  Keep all follow-up visits as told by your health care provider. This is important. How  is this prevented?  Use latex condoms correctly whenever you have sex.  Before you have sex, ask your partner if he or she has been tested for STIs. Ask about the test results.  Avoid having multiple sexual partners. Contact a health care provider if:  You continue to have any of the following symptoms 24 hours after beginning treatment: ? Fever. ? Chills. ? Headache. ? Nausea. ? Aching all over your body.  Your symptoms do not improve, even with treatment. Get help right away if:  You have severe chest pain.  You have trouble walking or coordinating movements.  You are confused.  You lose vision or hearing.  You have numbness in your arms or legs.  You have a seizure.  You faint.  You have a severe headache that does not go away with medicine. Summary  Syphilis is an infection that can spread through sexual contact.  This condition can cause serious complications, so it is best to get treatment right away. The condition can be cured with antibiotic medicine.  This condition can also be spread from mother to fetus during pregnancy.  Take your antibiotic medicine as told by your health care provider.  Tell your recent sexual partners that you were diagnosed with syphilis. It is important that they get treatment, even if they do not have symptoms. This information is not intended to replace advice given to you by your health care provider. Make sure you discuss any questions you have with your health care provider. Document Revised: 01/07/2019 Document Reviewed: 08/22/2016 Elsevier Patient Education  2021 [employer].

## 2020-09-03 ENCOUNTER — Observation Stay (HOSPITAL_COMMUNITY)
Admission: RE | Admit: 2020-09-03 | Discharge: 2020-09-03 | Disposition: A | Discharge status: Home / Self Care | Payer: Medicaid Other | Attending: Obstetrics and Gynecology | Admitting: Obstetrics and Gynecology

## 2020-09-03 ENCOUNTER — Encounter (HOSPITAL_COMMUNITY): Payer: Self-pay | Admitting: Obstetrics and Gynecology

## 2020-09-03 DIAGNOSIS — Z880 Allergy status to penicillin: Secondary | ICD-10-CM

## 2020-09-03 DIAGNOSIS — Z3A14 14 weeks gestation of pregnancy: Secondary | ICD-10-CM | POA: Insufficient documentation

## 2020-09-03 DIAGNOSIS — O98112 Syphilis complicating pregnancy, second trimester: Principal | ICD-10-CM | POA: Insufficient documentation

## 2020-09-03 DIAGNOSIS — A530 Latent syphilis, unspecified as early or late: Secondary | ICD-10-CM | POA: Diagnosis present

## 2020-09-03 DIAGNOSIS — Z8619 Personal history of other infectious and parasitic diseases: Secondary | ICD-10-CM | POA: Diagnosis present

## 2020-09-03 DIAGNOSIS — O99332 Smoking (tobacco) complicating pregnancy, second trimester: Secondary | ICD-10-CM | POA: Insufficient documentation

## 2020-09-03 DIAGNOSIS — Z20822 Contact with and (suspected) exposure to covid-19: Secondary | ICD-10-CM | POA: Insufficient documentation

## 2020-09-03 DIAGNOSIS — F17210 Nicotine dependence, cigarettes, uncomplicated: Secondary | ICD-10-CM | POA: Insufficient documentation

## 2020-09-03 HISTORY — DX: Allergy status to penicillin: Z88.0

## 2020-09-03 LAB — SARS CORONAVIRUS 2 (TAT 6-24 HRS): SARS Coronavirus 2: NEGATIVE

## 2020-09-03 MED ORDER — SODIUM CHLORIDE 0.9% FLUSH
0.1000 mL | Freq: Once | INTRAVENOUS | Status: AC
  Administered 2020-09-03: 0.1 mL via INTRADERMAL

## 2020-09-03 MED ORDER — PENICILLIN G BENZATHINE 1200000 UNIT/2ML IM SUSP
2.4000 10*6.[IU] | Freq: Once | INTRAMUSCULAR | Status: AC
  Administered 2020-09-03: 2.4 10*6.[IU] via INTRAMUSCULAR
  Filled 2020-09-03: qty 4

## 2020-09-03 MED ORDER — EPINEPHRINE 0.3 MG/0.3ML IJ SOAJ
0.3000 mg | Freq: Once | INTRAMUSCULAR | Status: DC | PRN
  Filled 2020-09-03: qty 0.6

## 2020-09-03 MED ORDER — SODIUM CHLORIDE 0.9% FLUSH
0.1000 mL | Freq: Once | INTRAVENOUS | Status: AC
  Administered 2020-09-03: 0.1 mL via TOPICAL

## 2020-09-03 MED ORDER — PENICILLIN G IN SODIUM CHLORIDE 10,000 UNITS/ML VIAL FOR SKIN TEST
1000.0000 [IU] | Freq: Once | INTRADERMAL | Status: AC
  Administered 2020-09-03: 1000 [IU] via TOPICAL
  Filled 2020-09-03 (×2): qty 1

## 2020-09-03 MED ORDER — PENICILLIN G IN SODIUM CHLORIDE 10,000 UNITS/ML VIAL FOR SKIN TEST (NO CHARGE)
1000.0000 [IU] | Freq: Once | INTRADERMAL | Status: AC
  Administered 2020-09-03: 1000 [IU] via INTRADERMAL
  Filled 2020-09-03: qty 1

## 2020-09-03 MED ORDER — DIPHENHYDRAMINE HCL 50 MG/ML IJ SOLN: 25.0000 mg | Freq: Once | INTRAMUSCULAR | Status: DC | PRN

## 2020-09-03 MED ORDER — BENZYLPENICILLOYL POLYLYSINE 0.25 ML ID SOLN (NO-CHARGE)
0.1000 mL | Freq: Once | INTRADERMAL | Status: AC
  Administered 2020-09-03: 0.1 mL via INTRADERMAL
  Filled 2020-09-03: qty 0.25

## 2020-09-03 MED ORDER — BENZYLPENICILLOYL POLYLYSINE 0.25 ML ID SOLN
0.0600 mL | Freq: Once | INTRADERMAL | Status: AC
  Administered 2020-09-03: 0.06 mL via TOPICAL
  Filled 2020-09-03 (×2): qty 0.25

## 2020-09-03 MED ORDER — AMOXICILLIN 500 MG PO CAPS
500.0000 mg | ORAL_CAPSULE | Freq: Once | ORAL | Status: AC
  Administered 2020-09-03: 500 mg via ORAL
  Filled 2020-09-03: qty 1

## 2020-09-03 MED ORDER — HISTAMINE PHOSPHATE 2.75 MG/ML IJ SOLN
0.1000 mL | Freq: Once | INTRAMUSCULAR | Status: AC
  Administered 2020-09-03: 0.1 mL via TOPICAL
  Filled 2020-09-03: qty 5
  Filled 2020-09-03: qty 0.1

## 2020-09-03 NOTE — H&P (Signed)
FACULTY PRACTICE ANTEPARTUM ADMISSION HISTORY AND PHYSICAL NOTE   History of Present Illness: Dawn Moody is a 25 y.o. 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  She only received 7 of 28 day course of treatment.  Once she left incarceration, she did not receive any further follow up or treatment.  The patient denies any genital lesions or palm/sole rash.  During routine pregnancy labs, pt is still positive with a 1:4 titer and positive treponemal antibodies. She cannot take doxycycline while pregnant and there is a questionable severe childhood reaction to penicillin.  She is admitted today for further allergy testing/desensitization. The pt denies vaginal bleeding or loss of fluid.  Patient Active Problem List   Diagnosis Date Noted  . [redacted] weeks gestation of pregnancy 09/03/2020  . History of penicillin allergy 09/03/2020  . History of syphilis 09/02/2020  . Rubella non-immune status, antepartum 09/01/2020  . Supervision of low-risk first pregnancy 08/30/2020  . Family history of breast cancer in mother 08/30/2020    Past Medical History:  Diagnosis Date  . Arthritis   . Obesity     Past Surgical History:  Procedure Laterality Date  . TYMPANOSTOMY TUBE PLACEMENT      OB History  Gravida Para Term Preterm AB Living  2 0 0 0 1 0  SAB IAB Ectopic Multiple Live Births  1 0 0 0 0    # Outcome Date GA Lbr Len/2nd Weight Sex Delivery Anes PTL Lv  2 Current           1 SAB             Social History   Socioeconomic History  . Marital status: Single    Spouse name: Not on file  . Number of children: Not on file  . Years of education: Not on file  . Highest education level: Not on file  Occupational History  . Not on file  Tobacco Use  . Smoking status: Current Every Day Smoker    Packs/day: 2.00     Types: Cigarettes  . Smokeless tobacco: Current User  . Tobacco comment: quit with pregnancy   Vaping Use  . Vaping Use: Former  Substance and Sexual Activity  . Alcohol use: Yes    Comment: occ prior to pregnancy   . Drug use: No  . Sexual activity: Yes    Birth control/protection: None  Other Topics Concern  . Not on file  Social History Narrative  . Not on file   Social Determinants of Health   Financial Resource Strain: Not on file  Food Insecurity: No Food Insecurity  . Worried About Programme researcher, broadcasting/film/video in the Last Year: Never true  . Ran Out of Food in the Last Year: Never true  Transportation Moody: No Transportation Moody  . Lack of Transportation (Medical): No  . Lack of Transportation (Non-Medical): No  Physical Activity: Not on file  Stress: Not on file  Social Connections: Not on file    Family History  Problem Relation Age of Onset  . Hypertension Mother   . Breast cancer Mother   . Cancer Mother   . Hypertension Father   . Diabetes Father   . Cancer Father     Allergies  Allergen Reactions  . Amoxicillin Anaphylaxis    Has patient had a PCN reaction causing immediate rash,  facial/tongue/throat swelling, SOB or lightheadedness with hypotension: Yes Has patient had a PCN reaction causing severe rash involving mucus membranes or skin necrosis: No Has patient had a PCN reaction that required hospitalization: Yes Has patient had a PCN reaction occurring within the last 10 years: No If all of the above answers are "NO", then may proceed with Cephalosporin use.   Marland Kitchen Penicillins Anaphylaxis    Medications Prior to Admission  Medication Sig Dispense Refill Last Dose  . prenatal vitamin w/FE, FA (PRENATAL 1 + 1) 27-1 MG TABS tablet Take 1 tablet by mouth daily at 12 noon. 30 tablet 11 09/02/2020 at Unknown time    Review of Systems - General ROS: negative  Vitals:  BP 121/68 (BP Location: Left Arm)   Pulse 70   Temp 98.5 F (36.9 C) (Oral)   Resp  18   Ht 5\' 9"  (1.753 m)   LMP 05/24/2020   SpO2 100%   BMI 32.78 kg/m  Physical Examination: CONSTITUTIONAL: Well-developed, well-nourished female in no acute distress.  HENT:  Normocephalic, atraumatic, External right and left ear normal. Oropharynx is clear and moist EYES: Conjunctivae and EOM are normal.  NECK: Normal range of motion, supple, no masses SKIN: Skin is warm and dry. No rash noted. Not diaphoretic. No erythema. No pallor.  No abnormal palmar or sole rashes. NEUROLGIC: Alert and oriented to person, place, and time. Normal reflexes, muscle tone coordination. No cranial nerve deficit noted. PSYCHIATRIC: Normal mood and affect. Normal behavior. Normal judgment and thought content. CARDIOVASCULAR: Normal heart rate noted, regular rhythm RESPIRATORY: Effort and breath sounds normal, no problems with respiration noted ABDOMEN: Soft, nontender, nondistended, gravid. MUSCULOSKELETAL: Normal range of motion. No edema and no tenderness. 2+ distal pulses.  Pelvic: not done Labs:  No results found for this or any previous visit (from the past 24 hour(s)).  Imaging Studies: No results found.   Assessment and Plan: Patient Active Problem List   Diagnosis Date Noted  . [redacted] weeks gestation of pregnancy 09/03/2020  . History of penicillin allergy 09/03/2020  . History of syphilis 09/02/2020  . Rubella non-immune status, antepartum 09/01/2020  . Supervision of low-risk first pregnancy 08/30/2020  . Family history of breast cancer in mother 08/30/2020   Admit to Antenatal Consult with pharmacy/ID team for penicillin desensitization. Will defer to pharmacy team for further management at this time per order set.  If pt does not have full allergy, will treat syphylis with routine treatment regimen with penicillin.  09/01/2020, MD Faculty attending, Center for [facility]

## 2020-09-03 NOTE — Progress Notes (Addendum)
Penicillin Skin Testing Assessment  Penicillin allergy skin testing completed on 09/03/20  Penicillin allergy skin testing requested by: Dr. Donavan Foil   Patient history of penicillin or beta lactam allergy: Dawn Moody reports that her mom told her that she "almost died" when she had penicillin when she was a child. The allergy was reported to be hives and she did require medical care.     Test Date Product Scratch Width (mm) Intradermal #1 Width (mm) Intradermal #2 Width (mm) Results (Pos/Neg/Ambiguous)  2/25 PRE-PEN (undiluted) 0 mm 0 mm 0 mm Neg   2/25 Penicillin G (10,000 U/mL) 1 mm 0 mm 0 mm Neg   2/25 Dilutent Control 1 mm 0 mm   Neg   2/25 Histamine (1mg /mL) 5 mm      + control     Criteria for positive prick skin test: Induration > 39mm greater than diluent control Criteria for positive intradermal skin test: Significant increase in size of original bleb with wheal diameter 51mm or more larger than diluent control; itching and flare are commonly present Criteria for negative intradermal skin test: No increase in size of original bleb and no reaction greater than control site Equivocal intradermal skin test: Wheal only slightly larger than initial injection bleb and control site, with or without erythematous flare OR duplicates are discordant. Control site: If wheal >2-3 mm after 20 min, repeat skin test to look for dermatographism     Interpretation: [x]  NEGATIVE for penicillin allergy []  POSITIVE for penicillin allergy      1m, PharmD, BCPS, BCIDP Infectious Diseases Clinical Pharmacist Phone: [phone] 09/03/2020 2:35 PM

## 2020-09-03 NOTE — Progress Notes (Signed)
30 minutes have passed since IM penicillin given. Pt has not had a reaction and VSS. Pt given discharge instructions. All questions answered.

## 2020-09-03 NOTE — Progress Notes (Signed)
Pt discharged in stable condition.

## 2020-09-03 NOTE — Progress Notes (Signed)
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 After discussion with the patient, patient states her mother told her she had an anaphylactic reaction to penicillins when she was a young child which required medical care as she "almost died"; patient does not remember. Patient states she would be willing to to a penicillin skin test for penicillin allergy which will be conducted later today (2/25).    Jani Gravel, PharmD-Candidate  2/25 11:14 AM  Infectious Diseases Consult Service

## 2020-09-03 NOTE — Discharge Instructions (Signed)
Syphilis Syphilis is an infection that can spread through sexual contact. The infection can cause serious complications, so it is important to get treatment right away. There are four stages of syphilis:  Primary stage. During this stage sores may form where the disease entered your body.  Secondary stage. During this stage skin rashes and lesions will form.  Latent stage. During this stage there are no symptoms, but the infection may still be contagious.  Tertiary stage. This stage happens 10-30 years after the infection starts. During this stage, the disease damages organs and can lead to death. Most people do not develop this stage of syphilis. What are the causes? This condition is caused by bacteria called Treponema pallidum. The condition can spread during sexual activity, such as during oral, anal, or vaginal sex. It can also be spread from mother to fetus during pregnancy. What increases the risk? You are more likely to develop this condition if:  You do not use a condom.  You have or had another sexually transmitted infection (STI).  You have multiple sex partners.  You use illegal drugs through an IV. What are the signs or symptoms? Symptoms of this condition depend on the stage of the disease. Primary stage  Painless sores (chancres) in and around the genital organs, mouth, or hands. The sores are usually firm and round. Secondary stage  A rash or sores. The rash usually does not itch.  A fever.  A headache.  A sore throat.  Swollen lymph nodes.  New sores in the mouth or on the genitals.  A feeling of being ill.  Pain in the joints.  Patchy hair loss.  Weight loss.  Fatigue. Latent stage There are no symptoms during this stage. Tertiary stage  Dementia.  Personality and mood changes.  Difficulty walking and coordinating movements.  Muscle weakness or paralysis.  Problems with coordination.  Heart failure.  Trouble  breathing.  Fainting.  Soft, rubbery growths on the skin, bones, or liver (gummas).  Sudden "lightning" pains, numbness, or tingling.  Vision changes.  Hearing changes.  Trouble controlling your urine and bowel movements. How is this diagnosed? This condition is diagnosed with:  A physical exam.  Blood tests.  Tests of the fluid (drainage) from a sore or rash.  Tests of the fluid around the spine (lumbar puncture). These tests are done to check for an infection in the brain or nervous system.  Imaging tests. These may be done to check for damage to the heart, aorta, or brain if the condition is in the tertiary stage. Tests may include: ? An X-ray. ? A CT scan. ? An MRI. ? An echocardiogram. This test takes a picture of the heart. ? An ultrasound.   How is this treated? This condition can be cured with antibiotic medicine. During the first day of treatment, the medicine may cause you to experience fever, chills, headache, nausea, or aching all over your body. This is normal and should go away within 24 hours. Follow these instructions at home: Medicines  Take over-the-counter and prescription medicines only as told by your health care provider.  Take your antibiotic medicine as told by your health care provider. Do not stop taking the antibiotic even if you start to feel better. Incomplete treatment will put you at risk for continued infection and could be life threatening.   General instructions  Do not have sex until your treatment is completed, or as directed by your health care provider.  Tell your recent sexual  partners that you were diagnosed with syphilis. It is important that they get treatment, even if they do not have symptoms.  Keep all follow-up visits as told by your health care provider. This is important. How is this prevented?  Use latex condoms correctly whenever you have sex.  Before you have sex, ask your partner if he or she has been tested for  STIs. Ask about the test results.  Avoid having multiple sexual partners. Contact a health care provider if:  You continue to have any of the following symptoms 24 hours after beginning treatment: ? Fever. ? Chills. ? Headache. ? Nausea. ? Aching all over your body.  Your symptoms do not improve, even with treatment. Get help right away if:  You have severe chest pain.  You have trouble walking or coordinating movements.  You are confused.  You lose vision or hearing.  You have numbness in your arms or legs.  You have a seizure.  You faint.  You have a severe headache that does not go away with medicine. Summary  Syphilis is an infection that can spread through sexual contact.  This condition can cause serious complications, so it is best to get treatment right away. The condition can be cured with antibiotic medicine.  This condition can also be spread from mother to fetus during pregnancy.  Take your antibiotic medicine as told by your health care provider.  Tell your recent sexual partners that you were diagnosed with syphilis. It is important that they get treatment, even if they do not have symptoms. This information is not intended to replace advice given to you by your health care provider. Make sure you discuss any questions you have with your health care provider. Document Revised: 01/07/2019 Document Reviewed: 08/22/2016 Elsevier Patient Education  2021 [employer]    Please [facility] to [facility] for your final two administrations of penicillin.  They should be given once weekly.

## 2020-09-03 NOTE — Discharge Summary (Signed)
Physician Discharge Summary  Patient ID: Dawn Moody MRN: [id number] DOB/AGE: 1996-03-18 25 y.o.  Admit date: 09/03/2020 Discharge date: 09/03/2020  Admission Diagnoses: Active Problems:   History of syphilis   [redacted] weeks gestation of pregnancy   History of penicillin allergy   Latent syphilis  Discharge Diagnoses:  Active Problems:   History of syphilis   [redacted] weeks gestation of pregnancy   History of penicillin allergy   Latent syphilis   Discharged Condition: good  Hospital Course: Pt was admitted for allergy testing for penicillin due to need for syphylis treatment.  Pt had skin testing and oral challenge.  No current penicillin allergy noted.  Pt will receive a dose of Penicillin G 2.4 million units before she is discharged.  Further penicillin treatments will be done with the health department,  Consults: Pharmacy  Significant Diagnostic Studies: n/a  Treatments: antibiotics: penicillin  Discharge Exam: Blood pressure 119/64, pulse 71, temperature 98.3 F (36.8 C), temperature source Oral, resp. rate 18, height 5\' 9"  (1.753 m), last menstrual period 05/24/2020, SpO2 100 %. General appearance: alert, cooperative and no distress  Disposition: Discharge disposition: 01-Home or Self Care       Discharge Instructions    Discharge activity:  No Restrictions   Complete by: As directed    Discharge diet:  No restrictions   Complete by: As directed    No sexual activity restrictions   Complete by: As directed      Allergies as of 09/03/2020   No Active Allergies     Medication List    TAKE these medications   prenatal vitamin w/FE, FA 27-1 MG Tabs tablet Take 1 tablet by mouth daily at 12 noon.       Follow-up Information    Department, [facility]. Schedule an appointment as soon as possible for a visit in 1 week(s).   Contact information: [address] [place] [phone]        Center for [phone] [facility] at [facility]. Schedule an appointment as soon as possible for a visit.   Specialty: Obstetrics and Gynecology Why: please be seen in 3-4 weeks or per your scheduled appointment Contact information: [address] [place] [phone]              Signed: [phone] 09/03/2020, 5:02 PM

## 2020-09-07 ENCOUNTER — Telehealth: Payer: Self-pay | Admitting: *Deleted

## 2020-09-07 NOTE — Telephone Encounter (Addendum)
Pt left VM message stating that she went to [facility] last week for desensitization of PCN allergy. She was told to follow up @ [facility] for next antibiotic injection on Friday this week and also next Friday. When she called to schedule appt, they were confused as to whether she needs to receive the medication @ their location.  Pt requests a call back to clarify what she should do.   3/2  Upon calling Cherae Lytch @ [facility] Syphilis clinic, I was informed that she spoke with pt yesterday and scheduled appointment for follow up injections and Syphilis care.

## 2020-09-08 ENCOUNTER — Encounter: Payer: Self-pay | Admitting: *Deleted

## 2020-09-14 ENCOUNTER — Encounter: Payer: Self-pay | Admitting: General Practice

## 2020-09-14 ENCOUNTER — Ambulatory Visit: Payer: Self-pay | Attending: Nurse Practitioner

## 2020-09-14 ENCOUNTER — Other Ambulatory Visit: Payer: Self-pay

## 2020-09-14 DIAGNOSIS — Z3402 Encounter for supervision of normal first pregnancy, second trimester: Secondary | ICD-10-CM | POA: Insufficient documentation

## 2020-09-14 DIAGNOSIS — O98112 Syphilis complicating pregnancy, second trimester: Secondary | ICD-10-CM

## 2020-09-14 DIAGNOSIS — Z363 Encounter for antenatal screening for malformations: Secondary | ICD-10-CM

## 2020-09-14 DIAGNOSIS — O2692 Pregnancy related conditions, unspecified, second trimester: Secondary | ICD-10-CM

## 2020-09-14 DIAGNOSIS — O99212 Obesity complicating pregnancy, second trimester: Secondary | ICD-10-CM

## 2020-09-14 DIAGNOSIS — E669 Obesity, unspecified: Secondary | ICD-10-CM

## 2020-09-14 DIAGNOSIS — O99332 Smoking (tobacco) complicating pregnancy, second trimester: Secondary | ICD-10-CM

## 2020-09-14 DIAGNOSIS — R030 Elevated blood-pressure reading, without diagnosis of hypertension: Secondary | ICD-10-CM

## 2020-09-14 DIAGNOSIS — Z3A16 16 weeks gestation of pregnancy: Secondary | ICD-10-CM

## 2020-09-14 DIAGNOSIS — A539 Syphilis, unspecified: Secondary | ICD-10-CM

## 2020-09-15 ENCOUNTER — Other Ambulatory Visit: Payer: Self-pay | Admitting: *Deleted

## 2020-09-15 DIAGNOSIS — Z362 Encounter for other antenatal screening follow-up: Secondary | ICD-10-CM

## 2020-09-27 ENCOUNTER — Encounter: Payer: Medicaid Other | Admitting: Nurse Practitioner

## 2020-10-04 ENCOUNTER — Other Ambulatory Visit: Payer: Self-pay

## 2020-10-04 ENCOUNTER — Ambulatory Visit (INDEPENDENT_AMBULATORY_CARE_PROVIDER_SITE_OTHER): Payer: Self-pay | Admitting: Family Medicine

## 2020-10-04 VITALS — BP 137/86 | HR 91 | Wt 226.8 lb

## 2020-10-04 DIAGNOSIS — E669 Obesity, unspecified: Secondary | ICD-10-CM | POA: Insufficient documentation

## 2020-10-04 DIAGNOSIS — O09899 Supervision of other high risk pregnancies, unspecified trimester: Secondary | ICD-10-CM

## 2020-10-04 DIAGNOSIS — Z8619 Personal history of other infectious and parasitic diseases: Secondary | ICD-10-CM

## 2020-10-04 DIAGNOSIS — A530 Latent syphilis, unspecified as early or late: Secondary | ICD-10-CM

## 2020-10-04 DIAGNOSIS — Z2839 Other underimmunization status: Secondary | ICD-10-CM

## 2020-10-04 DIAGNOSIS — O99891 Other specified diseases and conditions complicating pregnancy: Secondary | ICD-10-CM

## 2020-10-04 DIAGNOSIS — O219 Vomiting of pregnancy, unspecified: Secondary | ICD-10-CM

## 2020-10-04 DIAGNOSIS — Z880 Allergy status to penicillin: Secondary | ICD-10-CM

## 2020-10-04 DIAGNOSIS — Z720 Tobacco use: Secondary | ICD-10-CM

## 2020-10-04 DIAGNOSIS — E6609 Other obesity due to excess calories: Secondary | ICD-10-CM

## 2020-10-04 DIAGNOSIS — O169 Unspecified maternal hypertension, unspecified trimester: Secondary | ICD-10-CM

## 2020-10-04 DIAGNOSIS — Z3400 Encounter for supervision of normal first pregnancy, unspecified trimester: Secondary | ICD-10-CM

## 2020-10-04 DIAGNOSIS — Z283 Underimmunization status: Secondary | ICD-10-CM

## 2020-10-04 HISTORY — DX: Tobacco use: Z72.0

## 2020-10-04 NOTE — Progress Notes (Signed)
Pt states has been Nauseated.

## 2020-10-04 NOTE — Progress Notes (Signed)
PRENATAL VISIT NOTE  Subjective:  Dawn Moody is a 25 y.o. G2P0010 at [redacted]w[redacted]d being seen today for ongoing prenatal care.  She is currently monitored for the following issues for this high-risk pregnancy and has Supervision of low-risk first pregnancy; Family history of breast cancer in mother; Rubella non-immune status, antepartum; History of syphilis; History of penicillin allergy; Latent syphilis; Obesity; Elevated blood pressure affecting pregnancy, antepartum; and Nausea and vomiting in pregnancy on their problem list.  Patient reports nausea. Worse in the morning when she first wakes up, resolves throughout the day. Tolerating PO fine overall. Has not taken any meds for this. No sick contacts. No diarrhea or constipation. No hematemesis. Denies marijuana use.    Contractions: Not present. Vag. Bleeding: None.  Movement: Present. Denies leaking of fluid.   The following portions of the patient's history were reviewed and updated as appropriate: allergies, current medications, past family history, past medical history, past social history, past surgical history and problem list.   Objective:   Vitals:   10/04/20 1007  BP: 137/86  Pulse: 91  Weight: 226 lb 12.8 oz (102.9 kg)    Fetal Status: Fetal Heart Rate (bpm):  150   Movement: Present     General:  Alert, oriented and cooperative. Patient is in no acute distress.  Skin: Skin is warm and dry. No rash noted.   Cardiovascular: Normal heart rate noted  Respiratory: Normal respiratory effort, no problems with respiration noted  Abdomen: Soft, gravid, appropriate for gestational age.  Pain/Pressure: Present     Pelvic: Cervical exam deferred        Extremities: Normal range of motion.  Edema: Trace  Mental Status: Normal mood and affect. Normal behavior. Normal judgment and thought content.   Assessment and Plan:  Pregnancy: G2P0010 at [redacted]w[redacted]d  Encounter for supervision of low-risk first pregnancy, antepartum -Doing well,  complaints as noted below -VSS other than BP mildly elevated -Discussed contraception, patient elects for condoms -Taking PNV, instructed to start bASA given obesity  Obesity due to excess calories without serious comorbidity, unspecified classification BMI 33  Rubella non-immune status, antepartum MMR postpartum  History of syphilis History of penicillin allergy Latent syphilis Patient admitted for PCN desensitization after report of PCN allergy, not PCN allergic, treated @[facility] per patient.   Elevated blood pressure affecting pregnancy, antepartum BP today 137/86, asymptomatic. PreE labs collected today. No history of HTN per patient. Continue to monitor. -     Comp Met (CMET) -     Protein / creatinine ratio, urine -     CBC  Tobacco abuse Discussed risks of tobacco abuse in pregnancy, cessation encouraged.  Preterm labor symptoms and general obstetric precautions including but not limited to vaginal bleeding, contractions, leaking of fluid and fetal movement were reviewed in detail with the patient. Please refer to After Visit Summary for other counseling recommendations.   Return in about 4 weeks (around 11/01/2020) for LROB; in person.  Future Appointments  Date Time Provider Department Center  10/13/2020  7:45 AM [facility] NURSE [facility]  10/13/2020  8:00 AM [facility] US1 [facility]  11/01/2020 10:55 AM Burleson, Terri L, NP [facility]    Alicia C Firestone, MD  

## 2020-10-04 NOTE — Patient Instructions (Addendum)
-  start baby aspirin daily Continue prenatal vitamins Nothing to eat or drink before next visit-->sugar test [email or web address]-->birth control options  Safe Medications in Pregnancy   Acne:  Benzoyl Peroxide  Salicylic Acid   Backache/Headache:  Tylenol: 2 regular strength every 4 hours OR        2 Extra strength every 6 hours   Colds/Coughs/Allergies:  Benadryl (alcohol free) 25 mg every 6 hours as needed  Breath right strips  Claritin  Cepacol throat lozenges  Chloraseptic throat spray  Cold-Eeze- up to three times per day  Cough drops, alcohol free  Flonase (by prescription only)  Guaifenesin  Mucinex  Robitussin DM (plain only, alcohol free)  Saline nasal spray/drops  Sudafed (pseudoephedrine) & Actifed * use only after [redacted] weeks gestation and if you do not have high blood pressure  Tylenol  Vicks Vaporub  Zinc lozenges  Zyrtec   Constipation:  Colace  Ducolax suppositories  Fleet enema  Glycerin suppositories  Metamucil  Milk of magnesia  Miralax  Senokot  Smooth move tea   Diarrhea:  Kaopectate  Imodium A-D   *NO pepto Bismol   Hemorrhoids:  Anusol  Anusol HC  Preparation H  Tucks   Indigestion:  Tums  Maalox  Mylanta  Zantac  Pepcid   Insomnia:  Benadryl (alcohol free) 25mg  every 6 hours as needed  Tylenol PM  Unisom, no Gelcaps   Leg Cramps:  Tums  MagGel   Nausea/Vomiting:  Bonine  Dramamine  Emetrol  Ginger extract  Sea bands  Meclizine  Nausea medication to take during pregnancy:  Unisom (doxylamine succinate 25 mg tablets) Take one tablet daily at bedtime. If symptoms are not adequately controlled, the dose can be increased to a maximum recommended dose of two tablets daily (1/2 tablet in the morning, 1/2 tablet mid-afternoon and one at bedtime).  Vitamin B6 100mg  tablets. Take one tablet twice a day (up to 200 mg per day).   Skin Rashes:  Aveeno products  Benadryl cream or 25mg  every 6 hours as needed  Calamine Lotion   1% cortisone cream   Yeast infection:  Gyne-lotrimin 7  Monistat 7    **If taking multiple medications, please check labels to avoid duplicating the same active ingredients  **take medication as directed on the label  ** Do not exceed 4000 mg of tylenol in 24 hours  **Do not take medications that contain aspirin or ibuprofen

## 2020-10-05 LAB — CBC
Hematocrit: 32.5 % — ABNORMAL LOW (ref 34.0–46.6)
Hemoglobin: 11.2 g/dL (ref 11.1–15.9)
MCH: 31.8 pg (ref 26.6–33.0)
MCHC: 34.5 g/dL (ref 31.5–35.7)
MCV: 92 fL (ref 79–97)
Platelets: 169 10*3/uL (ref 150–450)
RBC: 3.52 x10E6/uL — ABNORMAL LOW (ref 3.77–5.28)
RDW: 12.1 % (ref 11.7–15.4)
WBC: 4.7 10*3/uL (ref 3.4–10.8)

## 2020-10-05 LAB — COMPREHENSIVE METABOLIC PANEL
ALT: 12 IU/L (ref 0–32)
AST: 15 IU/L (ref 0–40)
Albumin/Globulin Ratio: 1.7 (ref 1.2–2.2)
Albumin: 3.8 g/dL — ABNORMAL LOW (ref 3.9–5.0)
Alkaline Phosphatase: 46 IU/L (ref 44–121)
BUN/Creatinine Ratio: 12 (ref 9–23)
BUN: 7 mg/dL (ref 6–20)
Bilirubin Total: <0.2 mg/dL (ref 0.0–1.2)
CO2: 20 mmol/L (ref 20–29)
Calcium: 8.6 mg/dL — ABNORMAL LOW (ref 8.7–10.2)
Chloride: 104 mmol/L (ref 96–106)
Creatinine, Ser: 0.59 mg/dL (ref 0.57–1.00)
Globulin, Total: 2.3 g/dL (ref 1.5–4.5)
Glucose: 81 mg/dL (ref 65–99)
Potassium: 4 mmol/L (ref 3.5–5.2)
Sodium: 135 mmol/L (ref 134–144)
Total Protein: 6.1 g/dL (ref 6.0–8.5)
eGFR: 129 mL/min/{1.73_m2} (ref >59)

## 2020-10-13 ENCOUNTER — Other Ambulatory Visit: Payer: Self-pay | Admitting: *Deleted

## 2020-10-13 ENCOUNTER — Encounter: Payer: Self-pay | Admitting: *Deleted

## 2020-10-13 ENCOUNTER — Ambulatory Visit: Payer: Medicaid Other | Admitting: *Deleted

## 2020-10-13 ENCOUNTER — Other Ambulatory Visit: Payer: Self-pay

## 2020-10-13 ENCOUNTER — Ambulatory Visit: Payer: Self-pay | Attending: Obstetrics

## 2020-10-13 VITALS — BP 115/72 | HR 86

## 2020-10-13 DIAGNOSIS — O99332 Smoking (tobacco) complicating pregnancy, second trimester: Secondary | ICD-10-CM

## 2020-10-13 DIAGNOSIS — F17210 Nicotine dependence, cigarettes, uncomplicated: Secondary | ICD-10-CM

## 2020-10-13 DIAGNOSIS — E669 Obesity, unspecified: Secondary | ICD-10-CM

## 2020-10-13 DIAGNOSIS — Z363 Encounter for antenatal screening for malformations: Secondary | ICD-10-CM

## 2020-10-13 DIAGNOSIS — Z6831 Body mass index (BMI) 31.0-31.9, adult: Secondary | ICD-10-CM

## 2020-10-13 DIAGNOSIS — O99891 Other specified diseases and conditions complicating pregnancy: Secondary | ICD-10-CM

## 2020-10-13 DIAGNOSIS — Z362 Encounter for other antenatal screening follow-up: Secondary | ICD-10-CM | POA: Insufficient documentation

## 2020-10-13 DIAGNOSIS — A539 Syphilis, unspecified: Secondary | ICD-10-CM

## 2020-10-13 DIAGNOSIS — O99212 Obesity complicating pregnancy, second trimester: Secondary | ICD-10-CM

## 2020-10-13 DIAGNOSIS — R030 Elevated blood-pressure reading, without diagnosis of hypertension: Secondary | ICD-10-CM

## 2020-10-13 DIAGNOSIS — Z6833 Body mass index (BMI) 33.0-33.9, adult: Secondary | ICD-10-CM | POA: Insufficient documentation

## 2020-10-13 DIAGNOSIS — Z3A20 20 weeks gestation of pregnancy: Secondary | ICD-10-CM

## 2020-10-13 DIAGNOSIS — O321XX0 Maternal care for breech presentation, not applicable or unspecified: Secondary | ICD-10-CM

## 2020-10-13 DIAGNOSIS — O98112 Syphilis complicating pregnancy, second trimester: Secondary | ICD-10-CM

## 2020-11-01 ENCOUNTER — Encounter: Payer: Medicaid Other | Admitting: Advanced Practice Midwife

## 2020-11-08 IMAGING — CT CT ABD-PELV W/ CM
2 of 4 series · 15 of 46 positions shown, 17 images · IV contrast (OMNIPAQUE 300)
Comparison: None.
COMPARISON: None.

Addendum:
CLINICAL DATA: Right-sided abdominal pain

EXAM:
CT ABDOMEN AND PELVIS WITH CONTRAST
TECHNIQUE: Multidetector CT imaging of the abdomen and pelvis was performed
using the standard protocol following bolus administration of
intravenous contrast.
CONTRAST:  100mL OMNIPAQUE IOHEXOL 300 MG/ML  SOLN

[Series 2: axial st · axial · 0.72mm/px · z∈[-498,-73]mm · 12 of 97 slices shown, 14 images]
[im 6/97  soft-tissue]
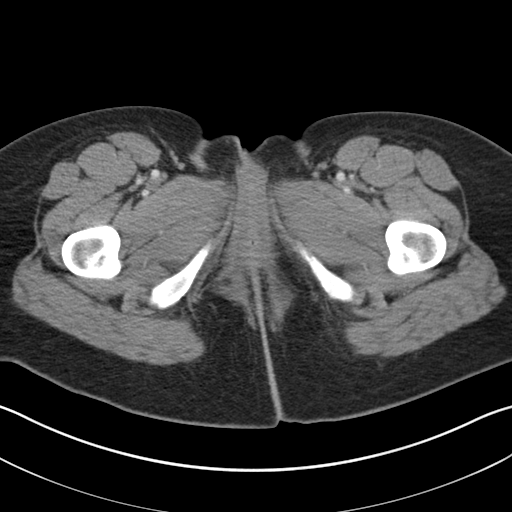
[im 6/97  bone]
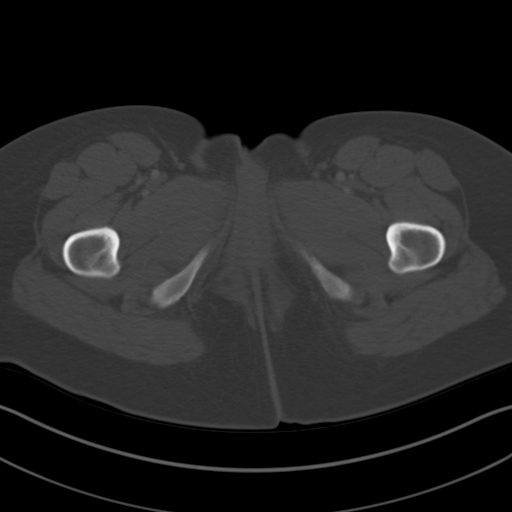
[im 17/97  soft-tissue]
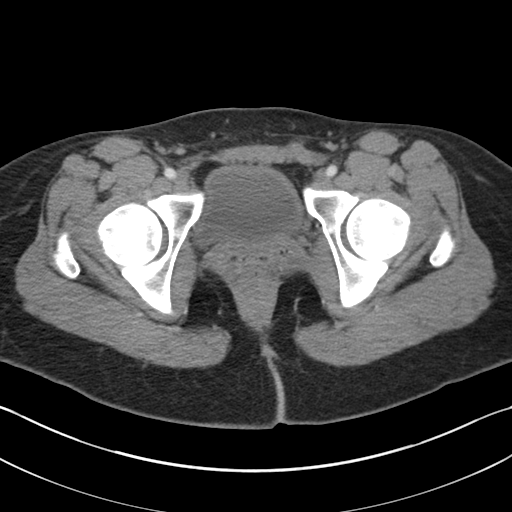
[im 23/97  soft-tissue]
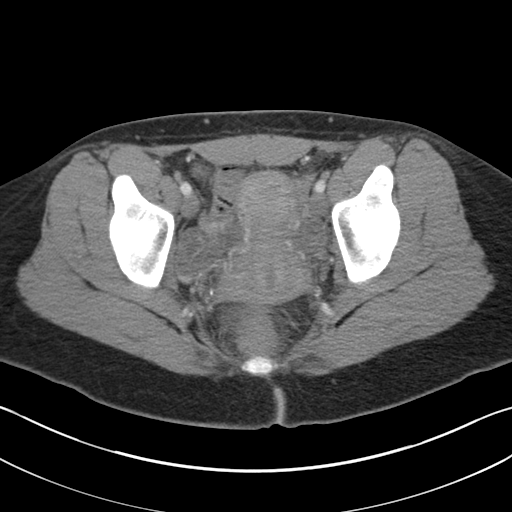
[im 29/97  soft-tissue]
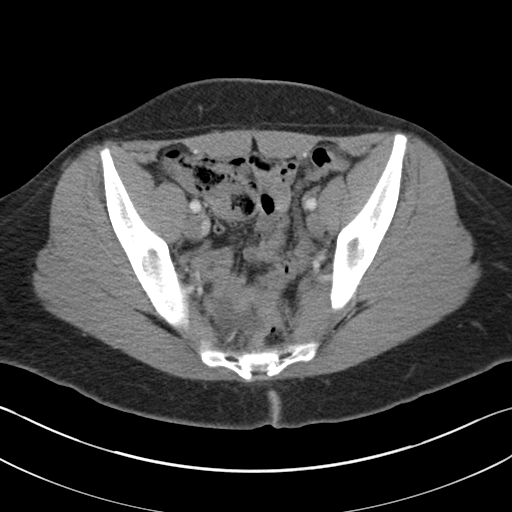
[im 40/97  soft-tissue]
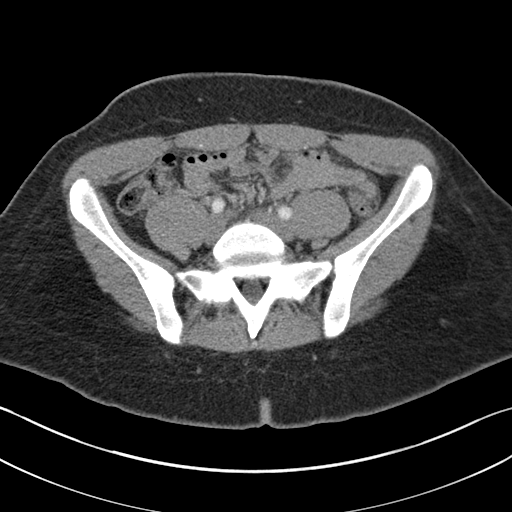
[im 46/97  soft-tissue]
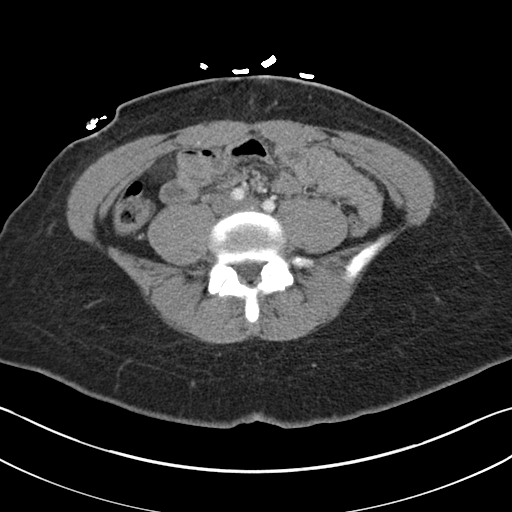
[im 51/97  soft-tissue]
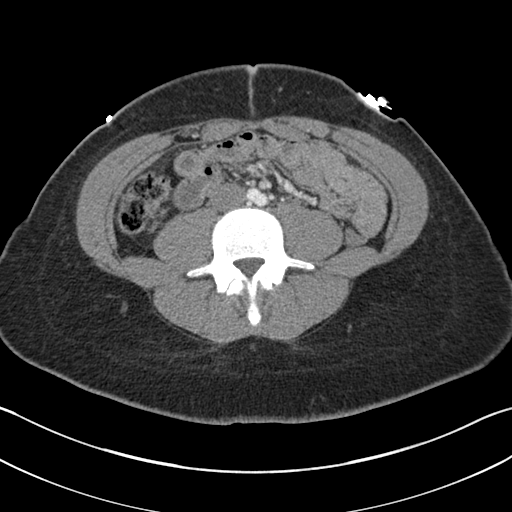
[im 63/97  soft-tissue]
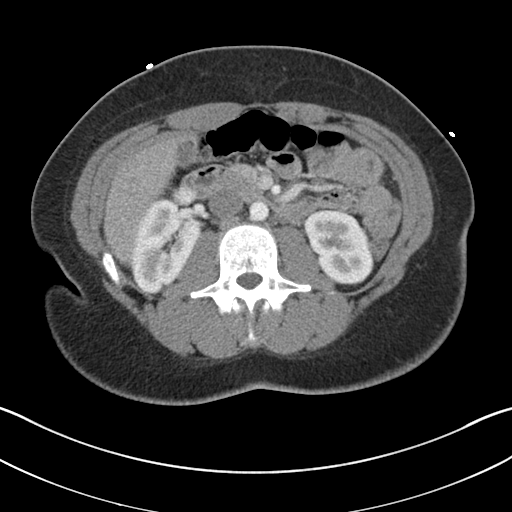
[im 68/97  soft-tissue]
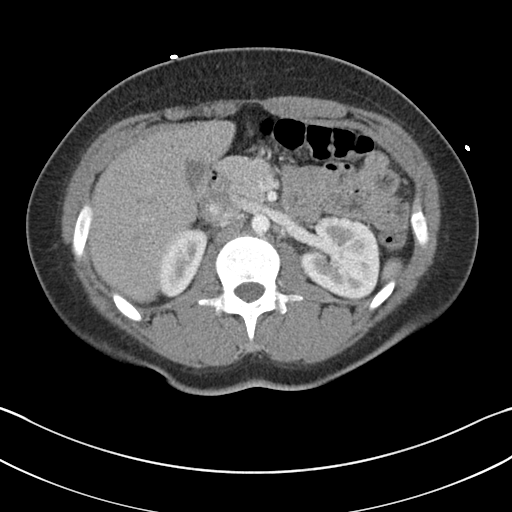
[im 68/97  bone]
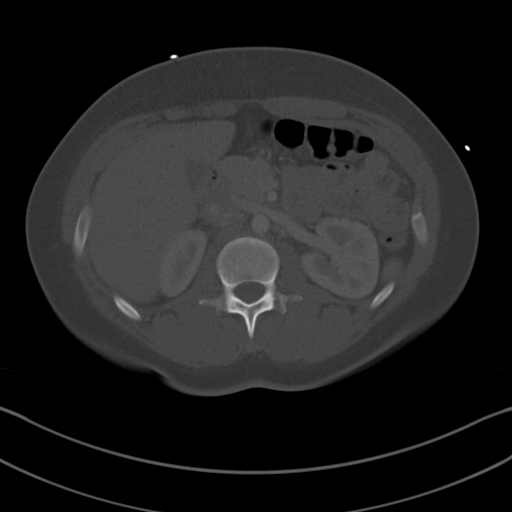
[im 74/97  soft-tissue]
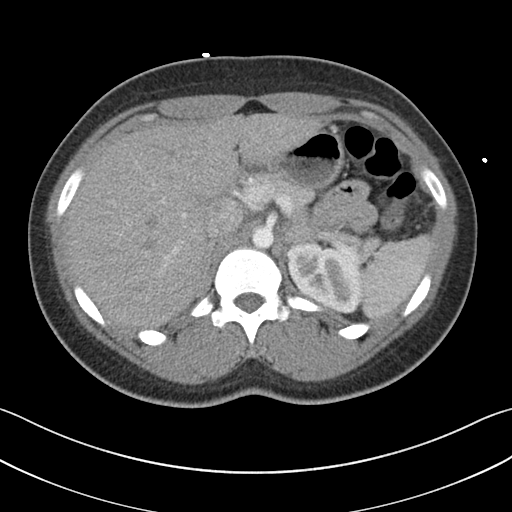
[im 85/97  soft-tissue]
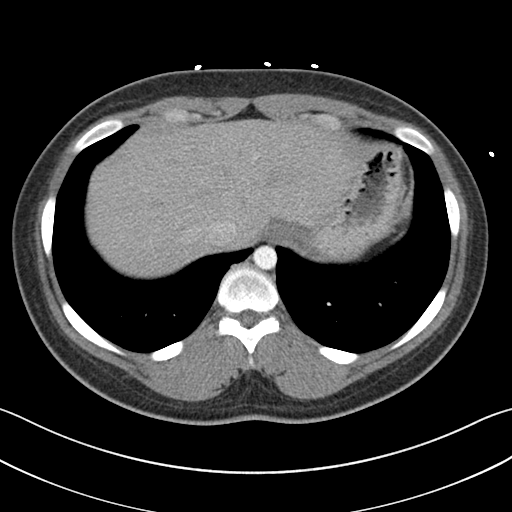
[im 91/97  soft-tissue]
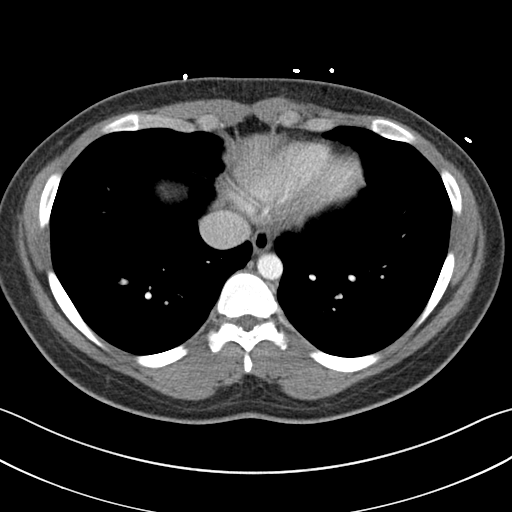

[Series 5: coronal st · coronal · 0.76mm/px · 3 of 149 slices shown]
[im 50/149  soft-tissue]
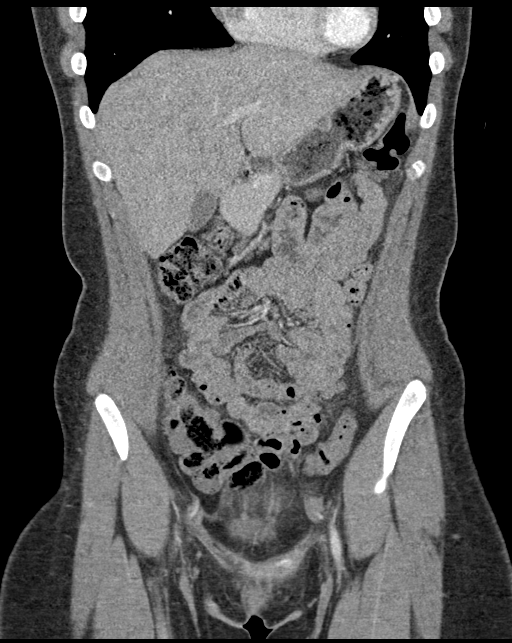
[im 66/149  soft-tissue]
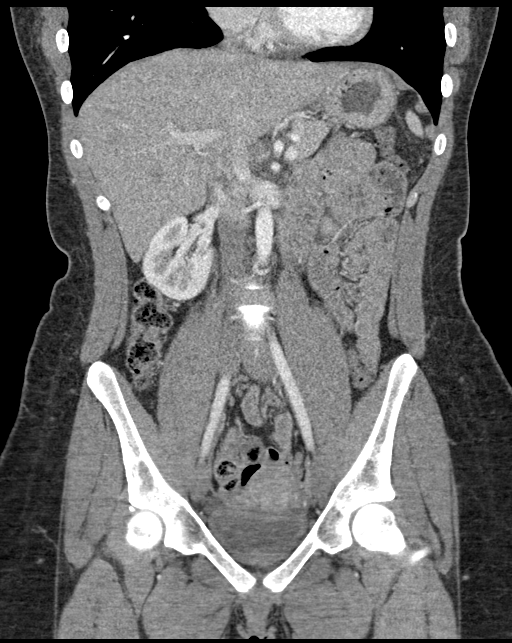
[im 83/149  soft-tissue]
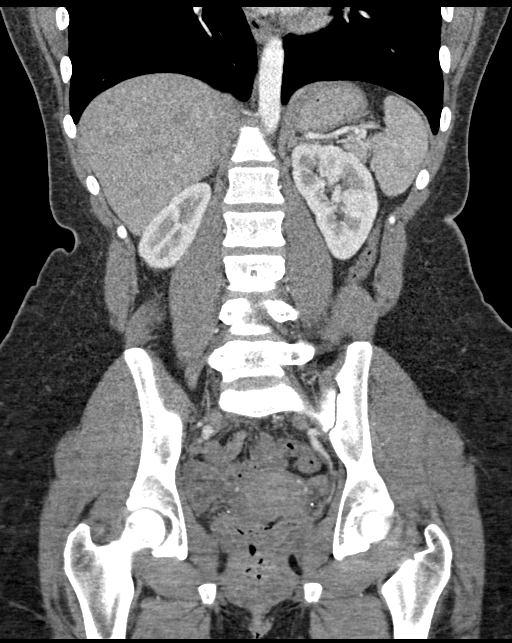

[15 of 46 positions shown; findings below may reference images not displayed]

FINDINGS: Lower chest: No acute abnormality.

Hepatobiliary: No focal liver abnormality is seen. No gallstones,
gallbladder wall thickening, or biliary dilatation.

Pancreas: Unremarkable. No pancreatic ductal dilatation or
surrounding inflammatory changes.

Spleen: Normal in size without focal abnormality.

Adrenals/Urinary Tract: Adrenal glands are unremarkable. Kidneys are
normal, without renal calculi, focal lesion, or hydronephrosis.
Bladder is unremarkable.

Stomach/Bowel: Stomach is within normal limits. Appendix appears
normal. No evidence of bowel wall thickening, distention, or
inflammatory changes.

Vascular/Lymphatic: No significant vascular findings are present. No
enlarged abdominal or pelvic lymph nodes.

Reproductive: Uterus and bilateral adnexa are unremarkable.

Other: Small free fluid in the pelvis. No free air. There appears to
be a small, non bowel containing left lower spigelian hernia

Musculoskeletal: No acute or significant osseous findings.
IMPRESSION: 1. No CT evidence for acute intra-abdominal or pelvic abnormality.
Negative for appendicitis.
2. Small free fluid in the pelvis.

ADDENDUM:
Correction small left fat containing groin hernia.

*** End of Addendum ***
FINDINGS: Lower chest: No acute abnormality.

Hepatobiliary: No focal liver abnormality is seen. No gallstones,
gallbladder wall thickening, or biliary dilatation.

Pancreas: Unremarkable. No pancreatic ductal dilatation or
surrounding inflammatory changes.

Spleen: Normal in size without focal abnormality.

Adrenals/Urinary Tract: Adrenal glands are unremarkable. Kidneys are
normal, without renal calculi, focal lesion, or hydronephrosis.
Bladder is unremarkable.

Stomach/Bowel: Stomach is within normal limits. Appendix appears
normal. No evidence of bowel wall thickening, distention, or
inflammatory changes.

Vascular/Lymphatic: No significant vascular findings are present. No
enlarged abdominal or pelvic lymph nodes.

Reproductive: Uterus and bilateral adnexa are unremarkable.

Other: Small free fluid in the pelvis. No free air. There appears to
be a small, non bowel containing left lower spigelian hernia

Musculoskeletal: No acute or significant osseous findings.
IMPRESSION: 1. No CT evidence for acute intra-abdominal or pelvic abnormality.
Negative for appendicitis.
2. Small free fluid in the pelvis.

## 2020-11-11 ENCOUNTER — Other Ambulatory Visit: Payer: Self-pay

## 2020-11-11 ENCOUNTER — Ambulatory Visit (INDEPENDENT_AMBULATORY_CARE_PROVIDER_SITE_OTHER): Payer: Self-pay | Admitting: Family Medicine

## 2020-11-11 VITALS — BP 128/75 | HR 102 | Wt 234.1 lb

## 2020-11-11 DIAGNOSIS — O43199 Other malformation of placenta, unspecified trimester: Secondary | ICD-10-CM | POA: Insufficient documentation

## 2020-11-11 DIAGNOSIS — A530 Latent syphilis, unspecified as early or late: Secondary | ICD-10-CM

## 2020-11-11 DIAGNOSIS — Z8619 Personal history of other infectious and parasitic diseases: Secondary | ICD-10-CM

## 2020-11-11 DIAGNOSIS — E6609 Other obesity due to excess calories: Secondary | ICD-10-CM

## 2020-11-11 DIAGNOSIS — O09899 Supervision of other high risk pregnancies, unspecified trimester: Secondary | ICD-10-CM

## 2020-11-11 DIAGNOSIS — Z720 Tobacco use: Secondary | ICD-10-CM

## 2020-11-11 DIAGNOSIS — O169 Unspecified maternal hypertension, unspecified trimester: Secondary | ICD-10-CM

## 2020-11-11 DIAGNOSIS — O219 Vomiting of pregnancy, unspecified: Secondary | ICD-10-CM

## 2020-11-11 DIAGNOSIS — Z2839 Other underimmunization status: Secondary | ICD-10-CM

## 2020-11-11 DIAGNOSIS — Z3400 Encounter for supervision of normal first pregnancy, unspecified trimester: Secondary | ICD-10-CM

## 2020-11-11 LAB — POCT URINALYSIS DIP (DEVICE)
Bilirubin Urine: NEGATIVE
Glucose, UA: NEGATIVE mg/dL
Hgb urine dipstick: NEGATIVE
Ketones, ur: NEGATIVE mg/dL
Leukocytes,Ua: NEGATIVE
Nitrite: NEGATIVE
Protein, ur: NEGATIVE mg/dL
Specific Gravity, Urine: 1.02 (ref 1.005–1.030)
Urobilinogen, UA: 0.2 mg/dL (ref 0.0–1.0)
pH: 7 (ref 5.0–8.0)

## 2020-11-11 MED ORDER — ONDANSETRON 4 MG PO TBDP: 4.0000 mg | ORAL_TABLET | Freq: Three times a day (TID) | ORAL | 0 refills | Status: DC | PRN

## 2020-11-11 NOTE — Progress Notes (Signed)
   PRENATAL VISIT NOTE  Subjective:  Dawn Moody is a 25 y.o. G2P0010 at 66w3dbeing seen today for ongoing prenatal care.  She is currently monitored for the following issues for this low-risk pregnancy and has Supervision of low-risk first pregnancy; Family history of breast cancer in mother; Rubella non-immune status, antepartum; History of syphilis; History of penicillin allergy; Latent syphilis; Obesity; Elevated blood pressure affecting pregnancy, antepartum; Nausea and vomiting in pregnancy; Tobacco abuse; and Marginal insertion of umbilical cord affecting management of mother on their problem list.  Patient reports nausea.  Contractions: Not present. Vag. Bleeding: None.  Movement: Present. Denies leaking of fluid.   The following portions of the patient's history were reviewed and updated as appropriate: allergies, current medications, past family history, past medical history, past social history, past surgical history and problem list.   Objective:   Vitals:   11/11/20 1515  BP: 128/75  Pulse: (!) 102  Weight: 234 lb 1.6 oz (106.2 kg)    Fetal Status: Fetal Heart Rate (bpm): 153   Movement: Present     General:  Alert, oriented and cooperative. Patient is in no acute distress.  Skin: Skin is warm and dry. No rash noted.   Cardiovascular: Normal heart rate noted  Respiratory: Normal respiratory effort, no problems with respiration noted  Abdomen: Soft, gravid, appropriate for gestational age.  Pain/Pressure: Present     Pelvic: Cervical exam deferred        Extremities: Normal range of motion.  Edema: None  Mental Status: Normal mood and affect. Normal behavior. Normal judgment and thought content.   Assessment and Plan:  Pregnancy: G2P0010 at 250w3d. Encounter for supervision of low-risk first pregnancy, antepartum -Doing well, complaints as noted below -VSS other than BP mildly elevated -Discussed contraception, patient elects for condoms -Taking PNV, instructed to  start bASA given obesity  2. Rubella non-immune status, antepartum MMR postpartum  3. History of syphilis 4. Latent syphilis Patient admitted for PCN desensitization after report of PCN allergy, not PCN allergic, treated _0  per patient. Refer to problem list for details regarding initial diagnosis. Titers 1:4, currently appropriate.  5. Obesity due to excess calories without serious comorbidity, unspecified classification   6. Elevated blood pressure affecting pregnancy, antepartum BP today wnl, previously elevated at last appt SBP 130s. asymptomatic. PreE labs previously nml, p/c not collected, will obtain today. No history of HTN per patient. Continue to monitor.  7. Tobacco abuse Discussed risks of tobacco abuse in pregnancy, cessation encouraged.  8. Nausea and vomiting in pregnancy Has been an issue this whole pregnancy. Denies THC use. Still able to tolerate PO. Taking unisom/b6 with relief of symptoms but causes drowsiness. Will send PRN zofran to pharmacy.  9. Pressure with urination Denies any other urinary symptoms. No dysuria, hematuria, urgency, frequency. POCT UA in clinic unremarkable, reassurance provided.  Preterm labor symptoms and general obstetric precautions including but not limited to vaginal bleeding, contractions, leaking of fluid and fetal movement were reviewed in detail with the patient. Please refer to After Visit Summary for other counseling recommendations.   Return in about 4 weeks (around 12/09/2020) for LRSunolin person.  Future Appointments  Date Time Provider [place]/07/2020  [facility]  1:15 PM [facility] US2 [facility]    AlArrie SenateMD

## 2020-11-11 NOTE — Progress Notes (Signed)
Patient complains of pressure/pain (sometimes)  after she urinates, started 3 weeks ago. Also, complains of lower back and pelvis pain at night.

## 2020-12-08 ENCOUNTER — Other Ambulatory Visit: Payer: Self-pay

## 2020-12-08 ENCOUNTER — Encounter: Payer: Self-pay | Admitting: *Deleted

## 2020-12-08 ENCOUNTER — Ambulatory Visit: Payer: Self-pay | Attending: Maternal & Fetal Medicine

## 2020-12-08 ENCOUNTER — Ambulatory Visit: Payer: Medicaid Other | Admitting: *Deleted

## 2020-12-08 VITALS — BP 126/73 | HR 84

## 2020-12-08 DIAGNOSIS — Z362 Encounter for other antenatal screening follow-up: Secondary | ICD-10-CM

## 2020-12-08 DIAGNOSIS — F17210 Nicotine dependence, cigarettes, uncomplicated: Secondary | ICD-10-CM

## 2020-12-08 DIAGNOSIS — Z3A28 28 weeks gestation of pregnancy: Secondary | ICD-10-CM

## 2020-12-08 DIAGNOSIS — O99213 Obesity complicating pregnancy, third trimester: Secondary | ICD-10-CM

## 2020-12-08 DIAGNOSIS — A539 Syphilis, unspecified: Secondary | ICD-10-CM

## 2020-12-08 DIAGNOSIS — Z6831 Body mass index (BMI) 31.0-31.9, adult: Secondary | ICD-10-CM | POA: Insufficient documentation

## 2020-12-08 DIAGNOSIS — O43199 Other malformation of placenta, unspecified trimester: Secondary | ICD-10-CM

## 2020-12-08 DIAGNOSIS — O98113 Syphilis complicating pregnancy, third trimester: Secondary | ICD-10-CM

## 2020-12-08 DIAGNOSIS — Z363 Encounter for antenatal screening for malformations: Secondary | ICD-10-CM

## 2020-12-08 DIAGNOSIS — E669 Obesity, unspecified: Secondary | ICD-10-CM

## 2020-12-08 DIAGNOSIS — O99333 Smoking (tobacco) complicating pregnancy, third trimester: Secondary | ICD-10-CM

## 2020-12-09 ENCOUNTER — Other Ambulatory Visit: Payer: Self-pay | Admitting: *Deleted

## 2020-12-09 DIAGNOSIS — Z3400 Encounter for supervision of normal first pregnancy, unspecified trimester: Secondary | ICD-10-CM

## 2020-12-10 ENCOUNTER — Other Ambulatory Visit: Payer: Medicaid Other

## 2020-12-10 ENCOUNTER — Encounter: Payer: Medicaid Other | Admitting: Family Medicine

## 2020-12-10 ENCOUNTER — Encounter: Payer: Self-pay | Admitting: Family Medicine

## 2020-12-10 NOTE — Progress Notes (Signed)
Patient did not keep appointment today. She will be called to reschedule.  

## 2020-12-13 ENCOUNTER — Other Ambulatory Visit: Payer: Self-pay

## 2020-12-13 ENCOUNTER — Ambulatory Visit (INDEPENDENT_AMBULATORY_CARE_PROVIDER_SITE_OTHER): Payer: Medicaid Other | Admitting: Family Medicine

## 2020-12-13 VITALS — BP 119/85 | HR 95 | Wt 229.4 lb

## 2020-12-13 DIAGNOSIS — O99213 Obesity complicating pregnancy, third trimester: Secondary | ICD-10-CM

## 2020-12-13 DIAGNOSIS — Z8619 Personal history of other infectious and parasitic diseases: Secondary | ICD-10-CM

## 2020-12-13 DIAGNOSIS — Z3A28 28 weeks gestation of pregnancy: Secondary | ICD-10-CM

## 2020-12-13 DIAGNOSIS — Z23 Encounter for immunization: Secondary | ICD-10-CM

## 2020-12-13 DIAGNOSIS — O0990 Supervision of high risk pregnancy, unspecified, unspecified trimester: Secondary | ICD-10-CM

## 2020-12-13 DIAGNOSIS — O219 Vomiting of pregnancy, unspecified: Secondary | ICD-10-CM

## 2020-12-13 DIAGNOSIS — O43199 Other malformation of placenta, unspecified trimester: Secondary | ICD-10-CM

## 2020-12-13 DIAGNOSIS — Z720 Tobacco use: Secondary | ICD-10-CM

## 2020-12-13 DIAGNOSIS — O99210 Obesity complicating pregnancy, unspecified trimester: Secondary | ICD-10-CM | POA: Insufficient documentation

## 2020-12-13 NOTE — Progress Notes (Signed)
   PRENATAL VISIT NOTE  Subjective:  Dawn Moody is a 25 y.o. G2P0010 at [redacted]w[redacted]d being seen today for ongoing prenatal care.  She is currently monitored for the following issues for this high-risk pregnancy and has Supervision of high risk pregnancy, antepartum; Family history of breast cancer in mother; Rubella non-immune status, antepartum; History of syphilis; History of penicillin allergy; Obesity; Elevated blood pressure affecting pregnancy, antepartum; Nausea and vomiting in pregnancy; Tobacco abuse; Marginal insertion of umbilical cord affecting management of mother; and Obesity affecting pregnancy on their problem list.  Patient reports no complaints.  Contractions: Not present. Vag. Bleeding: None.   . Denies leaking of fluid.   The following portions of the patient's history were reviewed and updated as appropriate: allergies, current medications, past family history, past medical history, past social history, past surgical history and problem list.   Objective:   Vitals:   12/13/20 0900  BP: 119/85  Pulse: 95  Weight: 229 lb 6.4 oz (104.1 kg)    Fetal Status: Fetal Heart Rate (bpm): 140         General:  Alert, oriented and cooperative. Patient is in no acute distress.  Skin: Skin is warm and dry. No rash noted.   Cardiovascular: Normal heart rate noted  Respiratory: Normal respiratory effort, no problems with respiration noted  Abdomen: Soft, gravid, appropriate for gestational age.  Pain/Pressure: Present     Pelvic: Cervical exam deferred        Extremities: Normal range of motion.  Edema: None  Mental Status: Normal mood and affect. Normal behavior. Normal judgment and thought content.   Assessment and Plan:  Pregnancy: G2P0010 at [redacted]w[redacted]d 1. [redacted] weeks gestation of pregnancy - Tdap vaccine greater than or equal to 7yo IM  2. Encounter for supervision of low-risk first pregnancy, antepartum Patient here for routine care and needs 3rd trimester labs Up to date  otherwise BP WNL today, continue to monitor  3. Nausea and vomiting in pregnancy Having active vomiting this AM, did not take unisom which does work to control sx. She elected to proceed with 2hr GTT  4. Marginal insertion of umbilical cord affecting management of mother FH is appropriate  5. Tobacco abuse Continue to monitor  6. History of syphilis Treated with PCN  7. Obesity affecting pregnancy in third trimester TWG=14 lb 6.4 oz (6.532 kg) which is within goal currently  Preterm labor symptoms and general obstetric precautions including but not limited to vaginal bleeding, contractions, leaking of fluid and fetal movement were reviewed in detail with the patient. Please refer to After Visit Summary for other counseling recommendations.   No follow-ups on file.  Future Appointments  Date Time Provider Department Center  01/05/2021  3:15 PM [facility] NURSE [facility]  01/05/2021  3:30 PM [facility] US3 [facility]    Federico Flake, MD

## 2020-12-13 NOTE — Progress Notes (Signed)
Patient stated that she feels pressure when she urinates

## 2020-12-14 LAB — GLUCOSE TOLERANCE, 2 HOURS W/ 1HR
Glucose, 1 hour: 133 mg/dL (ref 65–179)
Glucose, 2 hour: 33 mg/dL — CL (ref 65–152)
Glucose, Fasting: 82 mg/dL (ref 65–91)

## 2020-12-15 LAB — RPR, QUANT+TP ABS (REFLEX)
Rapid Plasma Reagin, Quant: 1:2 {titer} — ABNORMAL HIGH
T Pallidum Abs: REACTIVE — AB

## 2020-12-15 LAB — CBC
Hematocrit: 32.1 % — ABNORMAL LOW (ref 34.0–46.6)
Hemoglobin: 11.1 g/dL (ref 11.1–15.9)
MCH: 33.4 pg — ABNORMAL HIGH (ref 26.6–33.0)
MCHC: 34.6 g/dL (ref 31.5–35.7)
MCV: 97 fL (ref 79–97)
Platelets: 175 10*3/uL (ref 150–450)
RBC: 3.32 x10E6/uL — ABNORMAL LOW (ref 3.77–5.28)
RDW: 12.1 % (ref 11.7–15.4)
WBC: 6.4 10*3/uL (ref 3.4–10.8)

## 2020-12-15 LAB — RPR: RPR Ser Ql: REACTIVE — AB

## 2020-12-15 LAB — HIV ANTIBODY (ROUTINE TESTING W REFLEX): HIV Screen 4th Generation wRfx: NONREACTIVE

## 2020-12-27 ENCOUNTER — Encounter: Payer: Medicaid Other | Admitting: Obstetrics and Gynecology

## 2020-12-27 ENCOUNTER — Encounter: Payer: Self-pay | Admitting: Obstetrics and Gynecology

## 2020-12-28 NOTE — Progress Notes (Signed)
Patient did not keep her OB appointment for 12/27/2020.  Cornelia Copa MD Attending Center for [employer] [facility])

## 2021-01-03 ENCOUNTER — Ambulatory Visit: Payer: Medicaid Other | Admitting: Clinical

## 2021-01-03 DIAGNOSIS — F430 Acute stress reaction: Secondary | ICD-10-CM

## 2021-01-03 NOTE — BH Specialist Note (Signed)
Integrated Behavioral Health Initial In-Person Visit  MRN: [id number] Name: Dawn Moody  Number of Integrated Behavioral Health Clinician visits:: 1/6 Session Start time: 3:22  Session End time: 4:02 Total time: 40  minutes  Types of Service: Individual psychotherapy  Interpretor:No. Interpretor Name and Language: n/a   Warm Hand Off Completed.         Subjective: Dawn Moody is a 25 y.o. female accompanied by  n/a Patient was referred by Federico Flake, MDfor life stress. Patient reports the following symptoms/concerns: Pt's primary concern today is anxiety regarding telling her family she wants to return to FOB to work on relationship conflict, along with preparing for new first-time motherhood.  Duration of problem: Current pregnancy; Severity of problem: moderate  Objective: Mood: Anxious and Affect: Tearful Risk of harm to self or others: No plan to harm self or others  Life Context: Family and Social: Pt living temporarily with family; plans to move back with baby's father School/Work: - Self-Care: - Life Changes: Current first pregnancy; recent brief conflict with baby's father  Patient and/or Family's Strengths/Protective Factors: Social connections, Concrete supports in place (healthy food, safe environments, etc.), and Sense of purpose  Goals Addressed: Patient will: Reduce symptoms of: anxiety and stress Increase knowledge and/or ability of: self-management skills and stress reduction  Demonstrate ability to: Increase healthy adjustment to current life circumstances  Progress towards Goals: Ongoing  Interventions: Interventions utilized: Mindfulness or Management consultant, Psychoeducation and/or Health Education, and Link to [employer]  Standardized Assessments completed:  PHQ9/GAD7 given in past two weeks  Patient and/or Family Response: Pt agrees with treatment plan  Patient Centered Plan: Patient is on the following Treatment  Plan(s):  IBH  Assessment: Patient currently experiencing Acute stress reaction.   Patient may benefit from psychoeducation and brief therapeutic interventions regarding coping with symptoms of acute anxiety and stress .  Plan: Follow up with behavioral health clinician on : Two weeks Behavioral recommendations:  -Continue taking prenatal vitamin daily -CALM relaxation breathing exercise as needed throughout the day, to prepare for stressful interactions -Consider registering for childbirth education class, viewing virtual tour of women's hospital, at [email or web address] -Consider doula options for additional support in labor and delivery  Referral(s): Integrated Art gallery manager (In Clinic) and [employer]:  Doulas; childbirth classes   Rae Lips, [place]  Depression screen [facility] 2/9 12/13/2020 11/11/2020 10/06/2020 09/03/2020 08/30/2020  Decreased Interest 1 2 0 1 0  Down, Depressed, Hopeless 0 0 0 0 1  PHQ - 2 Score 1 2 0 1 1  Altered sleeping 1 2 2 1 1   Tired, decreased energy 1 2 2 1 1   Change in appetite 0 2 2 1 1   Feeling bad or failure about yourself  0 0 0 0 0  Trouble concentrating 0 0 0 0 0  Moving slowly or fidgety/restless 0 0 0 0 0  Suicidal thoughts 0 (No Data) 0 0 0  PHQ-9 Score 3 8 6 4 4    GAD 7 : Generalized Anxiety Score 12/13/2020 10/06/2020 09/03/2020 08/30/2020  Nervous, Anxious, on Edge 0 0 1 1  Control/stop worrying 0 0 0 0  Worry too much - different things 0 0 1 0  Trouble relaxing 0 0 0 0  Restless 0 0 0 0  Easily annoyed or irritable 0 0 0 1  Afraid - awful might happen 0 0 0 0  Total GAD 7 Score 0 0 2 2

## 2021-01-03 NOTE — Patient Instructions (Signed)
Center for [facility] at [facility] [place], [place] [address] [phone] (main office) [phone] [facility] office)  [email or web address] (virtual tour of [facility]; register for childbirth classes) [email or web address]   DOULA LIST   Beautiful Beginnings Doula  Geraldine  [phone]  [email or web address]  [email or web address]  [place] [phone]  [email or web address]   Precious Pole Ojea   [email or web address]   ??THE MOTHERLY DOULA?? Lajuana Ripple   [phone]   [email or web address]     The Abundant Life Doula  Mattie Marlin  [phone]    [email or web address]     [phone]     [email or web address]   Lenoria Farrier: [place] [phone]       [email or web address]  [email or web address]   [place] [phone]   [email or web address] [place], [place]  Llana Aliment  [phone]  [email or web address]   [email or web address]   Ease Doula [facility]   Burney Gauze   [phone]  [email or web address]  [phone] [email or web address] ContactWire.is  Natural Baby [place]     [phone] [email or web address]  [email or web address]   [facility]   [place] [phone] [email or web address]   [facility] Doula Services  Abbie Sons     [phone]  [email or web address]  [facility]     [phone]  [email or web address]  Facebook and IG @[email or web address] .Vivia [place]  [phone] [email or web address] .Dola Argyle [phone] [email or web address]   Delanna Ahmadi  [phone] [email or web address]     [facility]  [phone] [email or web address]   IG [email or web address]    Henrine Screws    [phone] [email or web address]   Rachel Moulds "[place]" Free  [phone] [email or web address]    Mtende Roll  [phone] [email or web address]   Susie Williams   [email or web address]    Crissie Reese    [phone] [email or web address]     Carlean Jews  [phone] [email or web address]    Rigoberto Noel  [phone] [email or web address]    [place] Rhem    [phone]   Baby on the Brain Karie Mainland  [phone] [email or web address]  Doula Mama Hollie [place] [phone] [email or web address] [email or web address]  Baby on the Brain Karie Mainland  [phone] [email or web address]  [facility] Owens Shark [phone]  [email or web address]  [email or web address] Nevins Harris-Jones  [phone] [email or web address] .[email or web address]   Renaee Munda [phone] [email or web address]   Einar Grad [phone] [email or web address]   Lance Muss  [phone] [email or web address]  [employer] [employer]  Deri Fuelling  [phone] [email or web address]   Maxwell Caul [phone] [email or web address]  MOOR Divine Myeka Dunn  [email or web address] .[email or web address]   Threasa Alpha [phone] [email or web address]   Freddie Apley [phone] [email or web address]   Kennedy Bucker [phone] [email or web address]        BRAINSTORMING  Develop a Plan Goals: Provide a way to start conversation about your new life with a baby Assist parents in recognizing and using resources within their reach Help pave the way before birth for an easier period of transition afterwards.  Make a list of the following  information to keep in a central location: Full name of Mom and Partner: _____________________________________________ 33 full name and Date of Birth: ___________________________________________ Home Address: ___________________________________________________________ ________________________________________________________________________ Home Phone: ____________________________________________________________ Parents'  cell numbers: _____________________________________________________ ________________________________________________________________________ Name and contact info for OB: ______________________________________________ Name and contact info for Pediatrician:________________________________________ Contact info for Lactation Consultants: ________________________________________  REST and SLEEP *You each need at least 4-5 hours of uninterrupted sleep every day. Write specific names and contact information.* How are you going to rest in the postpartum period? While partner's home? When partner returns to work? When you both return to work? Where will your baby sleep? Who is available to help during the day? Evening? Night? Who could move in for a period to help support you? What are some ideas to help you get enough sleep? __________________________________________________________________________________________________________________________________________________________________________________________________________________________________________ NUTRITIOUS FOOD AND DRINK *Plan for meals before your baby is born so you can have healthy food to eat during the immediate postpartum period.* Who will look after breakfast? Lunch? Dinner? List names and contact information. Brainstorm quick, healthy ideas for each meal. What can you do before baby is born to prepare meals for the postpartum period? How can others help you with meals? Which grocery stores provide online  shopping and delivery? Which restaurants offer take-out or delivery options? ______________________________________________________________________________________________________________________________________________________________________________________________________________________________________________________________________________________________________________________________________________________________________________________________________  CARE FOR MOM *It's important that mom is cared for and pampered in the postpartum period. Remember, the most important ways new mothers need care are: sleep, nutrition, gentle exercise, and time off.* Who can come take care of mom during this period? Make a list of people with their contact information. List some activities that make you feel cared for, rested, and energized? Who can make sure you have opportunities to do these things? Does mom have a space of her very own within your home that's just for her? Make a "[facility]" where she can be comfortable, rest, and renew herself daily. ______________________________________________________________________________________________________________________________________________________________________________________________________________________________________________________________________________________________________________________________________________________________________________________________________    CARE FOR AND FEEDING BABY *Knowledgeable and encouraging people will offer the best support with regard to feeding your baby.* Educate yourself and choose the best feeding option for your baby. Make a list of people who will guide, support, and be a resource for you as your care for and feed your baby. (Friends that have breastfed or are currently breastfeeding, lactation consultants, breastfeeding support groups, etc.) Consider a postpartum doula. (These websites can  give you information: dona.org & BuyingShow.es) Seek out local breastfeeding resources like the breastfeeding support group at [employer] or [employer]. ______________________________________________________________________________________________________________________________________________________________________________________________________________________________________________________________________________________________________________________________________________________________________________________________________  Verner Chol AND ERRANDS Who can help with a thorough cleaning before baby is born? Make a list of people who will help with housekeeping and chores, like laundry, light cleaning, dishes, bathrooms, etc. Who can run some errands for you? What can you do to make sure you are stocked with basic supplies before baby is born? Who is going to do the shopping? ______________________________________________________________________________________________________________________________________________________________________________________________________________________________________________________________________________________________________________________________________________________________________________________________________     Family Adjustment *Nurture yourselves.it helps parents be more loving and allows for better bonding with their child.* What sorts of things do you and partner enjoy doing together? Which activities help you to connect and strengthen your relationship? Make a list of those things. Make a list of people whom you trust to care for your baby so you can have some time together as a couple. What types of things help partner feel connected to Mom? Make a list. What needs will partner have in order to bond with baby? Other children? Who will care for them when you go into labor and while you are in the hospital? Think about what the needs  of your older children might be. Who can help you meet those needs? In what ways  are you helping them prepare for bringing baby home? List some specific strategies you have for family adjustment. _______________________________________________________________________________________________________________________________________________________________________________________________________________________________________________________________________________________________________________________________________________  SUPPORT *Someone who can empathize with experiences normalizes your problems and makes them more bearable.* Make a list of other friends, neighbors, and/or co-workers you know with infants (and small children, if applicable) with whom you can connect. Make a list of local or online support groups, mom groups, etc. in which you can be involved. ______________________________________________________________________________________________________________________________________________________________________________________________________________________________________________________________________________________________________________________________________________________________________________________________________  Childcare Plans Investigate and plan for childcare if mom is returning to work. Talk about mom's concerns about her transition back to work. Talk about partner's concerns regarding this transition.  Mental Health *Your mental health is one of the highest priorities for a pregnant or postpartum mom.* 1 in 5 women experience anxiety and/or depression from the time of conception through the first year after birth. Postpartum Mood Disorders are the #1 complication of pregnancy and childbirth and the suffering experienced by these mothers is not necessary! These illnesses are temporary and respond well to treatment, which often includes self-care, social  support, talk therapy, and medication when needed. Women experiencing anxiety and depression often say things like: "I'm supposed to be happy.why do I feel so sad?", "Why can't I snap out of it?", "I'm having thoughts that scare me." There is no need to be embarrassed if you are feeling these symptoms: Overwhelmed, anxious, angry, sad, guilty, irritable, hopeless, exhausted but can't sleep You are NOT alone. You are NOT to blame. With help, you WILL be well. Where can I find help? Medical professionals such as your OB, midwife, gynecologist, family practitioner, primary care provider, pediatrician, or mental health providers; [facility] support groups: Feelings After Birth, Breastfeeding Support Group, Baby and Me Group, and Fit 4 Two exercise classes. You have permission to ask for help. It will confirm your feelings, validate your experiences, share/learn coping strategies, and gain support and encouragement as you heal. You are important! BRAINSTORM Make a list of local resources, including resources for mom and for partner. Identify support groups. Identify people to call late at night - include names and contact info. Talk with partner about perinatal mood and anxiety disorders. Talk with your OB, midwife, and doula about baby blues and about perinatal mood and anxiety disorders. Talk with your pediatrician about perinatal mood and anxiety disorders.   Support & Sanity Savers   What do you really need?  Basics In preparing for a new baby, many expectant parents spend hours shopping for baby clothes, decorating the nursery, and deciding which car seat to buy. Yet most don't think much about what the reality of parenting a newborn will be like, and what they need to make it through that. So, here is the advice of experienced parents. We know you'll read this, and think "they're exaggerating, I don't really need that." Just trust [place] on these, OK? Plan for all of this, and if it turns out  you don't need it, come back and teach [place] how you did it!  Must-Haves (Once baby's survival needs are met, make sure you attend to your own survival needs!) Sleep An average newborn sleeps 16-18 hours per day, over 6-7 sleep periods, rarely more than three hours at a time. It is normal and healthy for a newborn to wake throughout the night... but really hard on parents!! Naps. Prioritize sleep above any responsibilities like: cleaning house, visiting friends, running errands, etc.  Sleep whenever baby sleeps. If you can't nap, at least have restful times when baby  eats. The more rest you get, the more patient you will be, the more emotionally stable, and better at solving problems.  Food You may not have realized it would be difficult to eat when you have a newborn. Yet, when we talk to countless new parents, they say things like "it may be 2:00 pm when I realize I haven't had breakfast yet." Or "every time we sit down to dinner, baby needs to eat, and my food gets cold, so I don't bother to eat it." Finger food. Before your baby is born, stock up with one months' worth of food that: 1) you can eat with one hand while holding a baby, 2) doesn't need to be prepped, 3) is good hot or cold, 4) doesn't spoil when left out for a few hours, and 5) you like to eat. Think about: nuts, dried fruit, Clif bars, pretzels, jerky, gogurt, baby carrots, apples, bananas, crackers, cheez-n-crackers, string cheese, hot pockets or frozen burritos to microwave, garden burgers and breakfast pastries to put in the toaster, yogurt drinks, etc. Restaurant Menus. Make lists of your favorite restaurants & menu items. When family/friends want to help, you can give specific information without much thought. They can either bring you the food or send gift cards for just the right meals. Freezer Meals.  Take some time to make a few meals to put in the freezer ahead of time.  Easy to freeze meals can be anything such as soup, lasagna,  chicken pie, or spaghetti sauce. Set up a Meal Schedule.  Ask friends and family to sign up to bring you meals during the first few weeks of being home. (It can be passed around at baby showers!) You have no idea how helpful this will be until you are in the throes of parenting.  https://[email or web address].com/ is a great website to check out. Emotional Support Know who to call when you're stressed out. Parenting a newborn is very challenging work. There are times when it totally overwhelms your normal coping abilities. EVERY NEW PARENT NEEDS TO HAVE A PLAN FOR WHO TO CALL WHEN THEY JUST CAN'T COPE ANY MORE. (And it has to be someone other than the baby's other parent!) Before your baby is born, come up with at least one person you can call for support - write their phone number down and post it on the refrigerator. Anxiety & Sadness. Baby blues are normal after pregnancy; however, there are more severe types of anxiety & sadness which can occur and should not be ignored.  They are always treatable, but you have to take the first step by reaching out for help. [facility] offers a "Mom Talk" group which meets every Tuesday from 10 am - 11 am.  This group is for new moms who need support and connection after their babies are born.  Call [phone].  Really, Really Helpful (Plan for them! Make sure these happen often!!) Physical Support with Taking Care of Yourselves Asking friends and family. Before your baby is born, set up a schedule of people who can come and visit and help out (or ask a friend to schedule for you). Any time someone says "let me know what I can do to help," sign them up for a day. When they get there, their job is not to take care of the baby (that's your job and your joy). Their job is to take care of you!  Postpartum doulas. If you don't have anyone you can call on for support, look into  postpartum doulas:  professionals at helping parents with caring for baby, caring for themselves,  getting breastfeeding started, and helping with household tasks. www.padanc.org is a helpful website for learning about doulas in our area. Peer Support / Parent Groups Why: One of the greatest ideas for new parents is to be around other new parents. Parent groups give you a chance to share and listen to others who are going through the same season of life, get a sense of what is normal infant development by watching several babies learn and grow, share your stories of triumph and struggles with empathetic ears, and forgive your own mistakes when you realize all parents are learning by trial and error. Where to find: There are many places you can meet other new parents throughout our community.  [facility] offers the following classes for new moms and their little ones:  Baby and Me (Birth to Bird-in-Hand) and Breastfeeding Support Group. Go to www.conehealthybaby.com or call [phone] for more information. Time for your Relationship It's easy to get so caught up in meeting baby's immediate needs that it's hard to find time to connect with your partner, and meet the needs of your relationship. It's also easy to forget what "quality time with your partner" actually looks like. If you take your baby on a date, you'd be amazed how much of your couple time is spent feeding the baby, diapering the baby, admiring the baby, and talking about the baby. Dating: Try to take time for just the two of you. Babysitter tip: Sometimes when moms are breastfeeding a newborn, they find it hard to figure out how to schedule outings around baby's unpredictable feeding schedules. Have the babysitter come for a three hour period. When she comes over, if baby has just eaten, you can leave right away, and come back in two hours. If baby hasn't fed recently, you start the date at home. Once baby gets hungry and gets a good feeding in, you can head out for the rest of your date time. Date Nights at Home: If you can't get out, at  least set aside one evening a week to prioritize your relationship: whenever baby dozes off or doesn't have any immediate needs, spend a little time focusing on each other. Potential conflicts: The main relationship conflicts that come up for new parents are: issues related to sexuality, financial stresses, a feeling of an unfair division of household tasks, and conflicts in parenting styles. The more you can work on these issues before baby arrives, the better!  Fun and Frills (Don't forget these. and don't feel guilty for indulging in them!) Everyone has something in life that is a fun little treat that they do just for themselves. It may be: reading the morning paper, or going for a daily jog, or having coffee with a friend once a week, or going to a movie on Friday nights, or fine chocolates, or bubble baths, or curling up with a good book. Unless you do fun things for yourself every now and then, it's hard to have the energy for fun with your baby. Whatever your "special" treats are, make sure you find a way to continue to indulge in them after your baby is born. These special moments can recharge you, and allow you to return to baby with a new joy   PERINATAL MOOD DISORDERS: [place]   Emergency and Crisis Resources:  If you are an imminent risk to self or others,  are experiencing intense personal distress, and/or have noticed significant changes in activities of daily living, call:  [facility]: [place]: [place]: [phone] Or visit the following crisis centers: Local Emergency Departments [facility]: [address] [place]. Hours: 8:30AM-5PM. Insurance Accepted: Medicaid, Medicare, and Uninsured.  [facility]  [address], [place] Mon-Friday 8am-3pm  [phone]                                                                                     Non-Crisis Resources: To identify specific providers that are covered by your insurance, contact your insurance company or local agencies: [place]: [phone] [facility]: [phone] [place]: [phone] Postpartum Support International- [place] 1-380-595-9462                                                      Outpatient therapy and medication management providers:  [facility] [phone] Hours: 9AM-5PM  Insurance Accepted: Alben Spittle, Lorella Nimrod, Freddrick March, [place], Medicare  [facility] ([place]) [phone] Hours: 8AM-5PM  nsurance Accepted: All insurances EXCEPT AARP, [place], [place], and [place]: [phone]             Hours: 8AM-8PM Insurance Accepted: Cristal Ford, Freddrick March, [place], Medicare, [phone] [facility]: [phone] Hours: 8:30AM-7PM Insurance Accepted: Cristal Ford, Medicaid, Medicare, Tricare, [employer] Counseling:  [place] Accepted:  Holland Falling, Lorella Nimrod, [facility], [place], [facility] [phone] Hours: 9AM-5:30PM Insurance Accepted: Alben Spittle, Charlotte Crumb, and Medicaid, Medicare, [employer] [facility] Counseling:  [phone] Hours: 9am-5pm Insurance Accepted: BCBS; they do not accept Medicaid/Medicare [place]: [phone] Hours: 9am-9pm Insurance Accepted: All major insurance including Medicaid and Medicare [facility]: [phone] Hours: 9AM-5:30PM Insurance Accepted: All insurances EXCEPT Medicaid and Medicare. [facility]: [place]:  [phone] [place]:  [place] (support for children in the NICU and/or with special needs), [phone]  Mental Health Support Groups Mental Health Association: [phone]                                                                                     Online Resources: Postpartum Support International: http://jones-berg.com/  841-282-0SHN 2Moms Supporting Moms:  www.momssupportingmoms.net

## 2021-01-04 NOTE — BH Specialist Note (Signed)
Pt did not arrive to video visit and did not answer the phone; Left HIPPA-compliant message to call back Fiore Detjen from [facility] at  [phone] (Dmya Long's office).  ?; left MyChart message for patient.  ? ?

## 2021-01-05 ENCOUNTER — Ambulatory Visit: Payer: Medicaid Other | Attending: Obstetrics and Gynecology

## 2021-01-05 ENCOUNTER — Encounter: Payer: Self-pay | Admitting: *Deleted

## 2021-01-05 ENCOUNTER — Other Ambulatory Visit: Payer: Self-pay

## 2021-01-05 ENCOUNTER — Ambulatory Visit: Payer: Medicaid Other | Admitting: *Deleted

## 2021-01-05 VITALS — BP 118/72 | HR 93

## 2021-01-05 DIAGNOSIS — Z363 Encounter for antenatal screening for malformations: Secondary | ICD-10-CM

## 2021-01-05 DIAGNOSIS — E669 Obesity, unspecified: Secondary | ICD-10-CM

## 2021-01-05 DIAGNOSIS — O99213 Obesity complicating pregnancy, third trimester: Secondary | ICD-10-CM

## 2021-01-05 DIAGNOSIS — O0990 Supervision of high risk pregnancy, unspecified, unspecified trimester: Secondary | ICD-10-CM

## 2021-01-05 DIAGNOSIS — O99333 Smoking (tobacco) complicating pregnancy, third trimester: Secondary | ICD-10-CM

## 2021-01-05 DIAGNOSIS — Z362 Encounter for other antenatal screening follow-up: Secondary | ICD-10-CM

## 2021-01-05 DIAGNOSIS — F17210 Nicotine dependence, cigarettes, uncomplicated: Secondary | ICD-10-CM

## 2021-01-05 DIAGNOSIS — Z3A32 32 weeks gestation of pregnancy: Secondary | ICD-10-CM

## 2021-01-05 DIAGNOSIS — O98113 Syphilis complicating pregnancy, third trimester: Secondary | ICD-10-CM

## 2021-01-06 ENCOUNTER — Other Ambulatory Visit: Payer: Self-pay | Admitting: *Deleted

## 2021-01-06 DIAGNOSIS — O3663X0 Maternal care for excessive fetal growth, third trimester, not applicable or unspecified: Secondary | ICD-10-CM

## 2021-01-18 ENCOUNTER — Ambulatory Visit: Payer: Self-pay | Admitting: Clinical

## 2021-01-18 DIAGNOSIS — Z5329 Procedure and treatment not carried out because of patient's decision for other reasons: Secondary | ICD-10-CM

## 2021-01-18 DIAGNOSIS — Z91199 Patient's noncompliance with other medical treatment and regimen due to unspecified reason: Secondary | ICD-10-CM

## 2021-02-09 ENCOUNTER — Ambulatory Visit: Payer: Medicaid Other | Attending: Obstetrics

## 2021-02-09 ENCOUNTER — Other Ambulatory Visit: Payer: Self-pay

## 2021-02-09 ENCOUNTER — Ambulatory Visit: Payer: Medicaid Other | Admitting: *Deleted

## 2021-02-09 ENCOUNTER — Encounter: Payer: Self-pay | Admitting: *Deleted

## 2021-02-09 VITALS — BP 109/64 | HR 67

## 2021-02-09 DIAGNOSIS — Z3A37 37 weeks gestation of pregnancy: Secondary | ICD-10-CM

## 2021-02-09 DIAGNOSIS — O99213 Obesity complicating pregnancy, third trimester: Secondary | ICD-10-CM | POA: Diagnosis present

## 2021-02-09 DIAGNOSIS — O43193 Other malformation of placenta, third trimester: Secondary | ICD-10-CM

## 2021-02-09 DIAGNOSIS — O99333 Smoking (tobacco) complicating pregnancy, third trimester: Secondary | ICD-10-CM

## 2021-02-09 DIAGNOSIS — O0990 Supervision of high risk pregnancy, unspecified, unspecified trimester: Secondary | ICD-10-CM | POA: Insufficient documentation

## 2021-02-09 DIAGNOSIS — O98113 Syphilis complicating pregnancy, third trimester: Secondary | ICD-10-CM | POA: Diagnosis not present

## 2021-02-09 DIAGNOSIS — E669 Obesity, unspecified: Secondary | ICD-10-CM

## 2021-02-09 DIAGNOSIS — O3663X0 Maternal care for excessive fetal growth, third trimester, not applicable or unspecified: Secondary | ICD-10-CM

## 2021-02-09 DIAGNOSIS — A539 Syphilis, unspecified: Secondary | ICD-10-CM | POA: Diagnosis not present

## 2021-02-09 DIAGNOSIS — F17210 Nicotine dependence, cigarettes, uncomplicated: Secondary | ICD-10-CM

## 2021-02-22 ENCOUNTER — Inpatient Hospital Stay (HOSPITAL_COMMUNITY): Payer: Medicaid Other | Admitting: Anesthesiology

## 2021-02-22 ENCOUNTER — Inpatient Hospital Stay (HOSPITAL_COMMUNITY)
Admission: AD | Admit: 2021-02-22 | Discharge: 2021-02-24 | DRG: 807 | Disposition: A | Discharge status: Home / Self Care | Payer: Medicaid Other | Attending: Family Medicine | Admitting: Family Medicine

## 2021-02-22 ENCOUNTER — Other Ambulatory Visit: Payer: Self-pay

## 2021-02-22 ENCOUNTER — Encounter (HOSPITAL_COMMUNITY): Payer: Self-pay | Admitting: Obstetrics & Gynecology

## 2021-02-22 DIAGNOSIS — O99334 Smoking (tobacco) complicating childbirth: Secondary | ICD-10-CM | POA: Diagnosis present

## 2021-02-22 DIAGNOSIS — Z3A39 39 weeks gestation of pregnancy: Secondary | ICD-10-CM

## 2021-02-22 DIAGNOSIS — O99214 Obesity complicating childbirth: Secondary | ICD-10-CM | POA: Diagnosis present

## 2021-02-22 DIAGNOSIS — Z20822 Contact with and (suspected) exposure to covid-19: Secondary | ICD-10-CM | POA: Diagnosis present

## 2021-02-22 DIAGNOSIS — F17210 Nicotine dependence, cigarettes, uncomplicated: Secondary | ICD-10-CM | POA: Diagnosis present

## 2021-02-22 DIAGNOSIS — O99213 Obesity complicating pregnancy, third trimester: Secondary | ICD-10-CM

## 2021-02-22 DIAGNOSIS — O99324 Drug use complicating childbirth: Secondary | ICD-10-CM | POA: Diagnosis present

## 2021-02-22 DIAGNOSIS — F1290 Cannabis use, unspecified, uncomplicated: Secondary | ICD-10-CM | POA: Diagnosis present

## 2021-02-22 DIAGNOSIS — R030 Elevated blood-pressure reading, without diagnosis of hypertension: Secondary | ICD-10-CM | POA: Diagnosis present

## 2021-02-22 DIAGNOSIS — O169 Unspecified maternal hypertension, unspecified trimester: Secondary | ICD-10-CM

## 2021-02-22 DIAGNOSIS — O134 Gestational [pregnancy-induced] hypertension without significant proteinuria, complicating childbirth: Secondary | ICD-10-CM | POA: Diagnosis not present

## 2021-02-22 DIAGNOSIS — O0990 Supervision of high risk pregnancy, unspecified, unspecified trimester: Secondary | ICD-10-CM

## 2021-02-22 DIAGNOSIS — Z3689 Encounter for other specified antenatal screening: Secondary | ICD-10-CM

## 2021-02-22 DIAGNOSIS — Z880 Allergy status to penicillin: Secondary | ICD-10-CM

## 2021-02-22 DIAGNOSIS — O479 False labor, unspecified: Secondary | ICD-10-CM

## 2021-02-22 DIAGNOSIS — Z2839 Other underimmunization status: Secondary | ICD-10-CM

## 2021-02-22 DIAGNOSIS — Z8619 Personal history of other infectious and parasitic diseases: Secondary | ICD-10-CM | POA: Diagnosis present

## 2021-02-22 LAB — COMPREHENSIVE METABOLIC PANEL
ALT: 11 U/L (ref 0–44)
AST: 17 U/L (ref 15–41)
Albumin: 3.2 g/dL — ABNORMAL LOW (ref 3.5–5.0)
Alkaline Phosphatase: 124 U/L (ref 38–126)
Anion gap: 10 (ref 5–15)
BUN: 6 mg/dL (ref 6–20)
CO2: 20 mmol/L — ABNORMAL LOW (ref 22–32)
Calcium: 9.2 mg/dL (ref 8.9–10.3)
Chloride: 106 mmol/L (ref 98–111)
Creatinine, Ser: 0.72 mg/dL (ref 0.44–1.00)
GFR, Estimated: >60 mL/min (ref >60)
Glucose, Bld: 83 mg/dL (ref 70–99)
Potassium: 3.7 mmol/L (ref 3.5–5.1)
Sodium: 136 mmol/L (ref 135–145)
Total Bilirubin: 0.8 mg/dL (ref 0.3–1.2)
Total Protein: 6.6 g/dL (ref 6.5–8.1)

## 2021-02-22 LAB — RESP PANEL BY RT-PCR (FLU A&B, COVID) ARPGX2
Influenza A by PCR: NEGATIVE
Influenza B by PCR: NEGATIVE
SARS Coronavirus 2 by RT PCR: NEGATIVE

## 2021-02-22 LAB — CBC
HCT: 35.5 % — ABNORMAL LOW (ref 36.0–46.0)
Hemoglobin: 12 g/dL (ref 12.0–15.0)
MCH: 33.1 pg (ref 26.0–34.0)
MCHC: 33.8 g/dL (ref 30.0–36.0)
MCV: 97.8 fL (ref 80.0–100.0)
Platelets: 165 10*3/uL (ref 150–400)
RBC: 3.63 MIL/uL — ABNORMAL LOW (ref 3.87–5.11)
RDW: 12.3 % (ref 11.5–15.5)
WBC: 7.1 10*3/uL (ref 4.0–10.5)
nRBC: 0 % (ref 0.0–0.2)

## 2021-02-22 LAB — RPR
RPR Ser Ql: REACTIVE — AB
RPR Titer: 1:4 {titer}

## 2021-02-22 LAB — TYPE AND SCREEN
ABO/RH(D): O POS
Antibody Screen: NEGATIVE

## 2021-02-22 LAB — PROTEIN / CREATININE RATIO, URINE
Creatinine, Urine: 28.15 mg/dL
Total Protein, Urine: <6 mg/dL

## 2021-02-22 MED ORDER — FENTANYL-BUPIVACAINE-NACL 0.5-0.125-0.9 MG/250ML-% EP SOLN
EPIDURAL | Status: DC | PRN
  Administered 2021-02-22: 12 mL/h via EPIDURAL

## 2021-02-22 MED ORDER — OXYTOCIN-SODIUM CHLORIDE 30-0.9 UT/500ML-% IV SOLN
2.5000 [IU]/h | INTRAVENOUS | Status: DC
  Administered 2021-02-22: 2.5 [IU]/h via INTRAVENOUS
  Filled 2021-02-22: qty 500

## 2021-02-22 MED ORDER — OXYTOCIN-SODIUM CHLORIDE 30-0.9 UT/500ML-% IV SOLN
1.0000 m[IU]/min | INTRAVENOUS | Status: DC
  Administered 2021-02-22: 2 m[IU]/min via INTRAVENOUS

## 2021-02-22 MED ORDER — SENNOSIDES-DOCUSATE SODIUM 8.6-50 MG PO TABS
2.0000 | ORAL_TABLET | Freq: Every day | ORAL | Status: DC
  Administered 2021-02-23 – 2021-02-24 (×2): 2 via ORAL
  Filled 2021-02-22 (×2): qty 2

## 2021-02-22 MED ORDER — FENTANYL-BUPIVACAINE-NACL 0.5-0.125-0.9 MG/250ML-% EP SOLN
12.0000 mL/h | EPIDURAL | Status: DC | PRN
  Filled 2021-02-22: qty 250

## 2021-02-22 MED ORDER — OXYCODONE-ACETAMINOPHEN 5-325 MG PO TABS: 2.0000 | ORAL_TABLET | ORAL | Status: DC | PRN

## 2021-02-22 MED ORDER — BENZOCAINE-MENTHOL 20-0.5 % EX AERO
1.0000 "application " | INHALATION_SPRAY | CUTANEOUS | Status: DC | PRN
  Administered 2021-02-22: 1 via TOPICAL
  Filled 2021-02-22: qty 56

## 2021-02-22 MED ORDER — ONDANSETRON HCL 4 MG PO TABS: 4.0000 mg | ORAL_TABLET | ORAL | Status: DC | PRN

## 2021-02-22 MED ORDER — LABETALOL HCL 5 MG/ML IV SOLN: 40.0000 mg | INTRAVENOUS | Status: DC | PRN

## 2021-02-22 MED ORDER — SODIUM CHLORIDE 0.9 % IV SOLN
5.0000 10*6.[IU] | Freq: Once | INTRAVENOUS | Status: AC
  Administered 2021-02-22: 5 10*6.[IU] via INTRAVENOUS
  Filled 2021-02-22: qty 5

## 2021-02-22 MED ORDER — LIDOCAINE HCL (PF) 1 % IJ SOLN: 30.0000 mL | INTRAMUSCULAR | Status: DC | PRN

## 2021-02-22 MED ORDER — DIPHENHYDRAMINE HCL 50 MG/ML IJ SOLN: 12.5000 mg | INTRAMUSCULAR | Status: DC | PRN

## 2021-02-22 MED ORDER — TERBUTALINE SULFATE 1 MG/ML IJ SOLN: 0.2500 mg | Freq: Once | INTRAMUSCULAR | Status: DC | PRN

## 2021-02-22 MED ORDER — ACETAMINOPHEN 325 MG PO TABS
650.0000 mg | ORAL_TABLET | ORAL | Status: DC | PRN
  Administered 2021-02-22: 650 mg via ORAL
  Filled 2021-02-22: qty 2

## 2021-02-22 MED ORDER — OXYTOCIN BOLUS FROM INFUSION
333.0000 mL | Freq: Once | INTRAVENOUS | Status: AC
Start: 2021-02-22 — End: 2021-02-22
  Administered 2021-02-22: 333 mL via INTRAVENOUS

## 2021-02-22 MED ORDER — IBUPROFEN 600 MG PO TABS
600.0000 mg | ORAL_TABLET | Freq: Four times a day (QID) | ORAL | Status: DC
  Administered 2021-02-22 – 2021-02-24 (×5): 600 mg via ORAL
  Filled 2021-02-22 (×6): qty 1

## 2021-02-22 MED ORDER — HYDRALAZINE HCL 20 MG/ML IJ SOLN: 10.0000 mg | INTRAMUSCULAR | Status: DC | PRN

## 2021-02-22 MED ORDER — COCONUT OIL OIL: 1.0000 "application " | TOPICAL_OIL | Status: DC | PRN

## 2021-02-22 MED ORDER — PHENYLEPHRINE 40 MCG/ML (10ML) SYRINGE FOR IV PUSH (FOR BLOOD PRESSURE SUPPORT): 80.0000 ug | PREFILLED_SYRINGE | INTRAVENOUS | Status: DC | PRN

## 2021-02-22 MED ORDER — FENTANYL CITRATE (PF) 100 MCG/2ML IJ SOLN
100.0000 ug | INTRAMUSCULAR | Status: DC | PRN
  Administered 2021-02-22 (×2): 100 ug via INTRAVENOUS
  Filled 2021-02-22 (×2): qty 2

## 2021-02-22 MED ORDER — MEASLES, MUMPS & RUBELLA VAC IJ SOLR: 0.5000 mL | Freq: Once | INTRAMUSCULAR | Status: DC

## 2021-02-22 MED ORDER — DIPHENHYDRAMINE HCL 25 MG PO CAPS: 25.0000 mg | ORAL_CAPSULE | Freq: Four times a day (QID) | ORAL | Status: DC | PRN

## 2021-02-22 MED ORDER — LIDOCAINE HCL (PF) 1 % IJ SOLN
INTRAMUSCULAR | Status: DC | PRN
  Administered 2021-02-22: 10 mL via EPIDURAL
  Administered 2021-02-22: 2 mL via EPIDURAL

## 2021-02-22 MED ORDER — EPHEDRINE 5 MG/ML INJ: 10.0000 mg | INTRAVENOUS | Status: DC | PRN

## 2021-02-22 MED ORDER — LACTATED RINGERS IV SOLN
500.0000 mL | Freq: Once | INTRAVENOUS | Status: AC
  Administered 2021-02-22: 500 mL via INTRAVENOUS

## 2021-02-22 MED ORDER — LACTATED RINGERS IV SOLN: 500.0000 mL | INTRAVENOUS | Status: DC | PRN

## 2021-02-22 MED ORDER — LACTATED RINGERS IV SOLN
INTRAVENOUS | Status: DC
  Administered 2021-02-22: 125 mL/h via INTRAVENOUS

## 2021-02-22 MED ORDER — SOD CITRATE-CITRIC ACID 500-334 MG/5ML PO SOLN: 30.0000 mL | ORAL | Status: DC | PRN

## 2021-02-22 MED ORDER — LABETALOL HCL 5 MG/ML IV SOLN: 20.0000 mg | INTRAVENOUS | Status: DC | PRN

## 2021-02-22 MED ORDER — PRENATAL MULTIVITAMIN CH
1.0000 | ORAL_TABLET | Freq: Every day | ORAL | Status: DC
  Administered 2021-02-23: 1 via ORAL
  Filled 2021-02-22: qty 1

## 2021-02-22 MED ORDER — DIBUCAINE (PERIANAL) 1 % EX OINT: 1.0000 "application " | TOPICAL_OINTMENT | CUTANEOUS | Status: DC | PRN

## 2021-02-22 MED ORDER — TETANUS-DIPHTH-ACELL PERTUSSIS 5-2.5-18.5 LF-MCG/0.5 IM SUSY: 0.5000 mL | PREFILLED_SYRINGE | Freq: Once | INTRAMUSCULAR | Status: DC

## 2021-02-22 MED ORDER — LABETALOL HCL 5 MG/ML IV SOLN
80.0000 mg | INTRAVENOUS | Status: DC | PRN
Start: 2021-02-22 — End: 2021-02-22

## 2021-02-22 MED ORDER — ONDANSETRON HCL 4 MG/2ML IJ SOLN: 4.0000 mg | INTRAMUSCULAR | Status: DC | PRN

## 2021-02-22 MED ORDER — ACETAMINOPHEN 325 MG PO TABS: 650.0000 mg | ORAL_TABLET | ORAL | Status: DC | PRN

## 2021-02-22 MED ORDER — ONDANSETRON HCL 4 MG/2ML IJ SOLN
4.0000 mg | Freq: Four times a day (QID) | INTRAMUSCULAR | Status: DC | PRN
  Administered 2021-02-22: 4 mg via INTRAVENOUS
  Filled 2021-02-22: qty 2

## 2021-02-22 MED ORDER — SIMETHICONE 80 MG PO CHEW: 80.0000 mg | CHEWABLE_TABLET | ORAL | Status: DC | PRN

## 2021-02-22 MED ORDER — PENICILLIN G POT IN DEXTROSE 60000 UNIT/ML IV SOLN
3.0000 10*6.[IU] | INTRAVENOUS | Status: DC
  Administered 2021-02-22 (×2): 3 10*6.[IU] via INTRAVENOUS
  Filled 2021-02-22 (×2): qty 50

## 2021-02-22 MED ORDER — OXYCODONE-ACETAMINOPHEN 5-325 MG PO TABS: 1.0000 | ORAL_TABLET | ORAL | Status: DC | PRN

## 2021-02-22 MED ORDER — WITCH HAZEL-GLYCERIN EX PADS: 1.0000 "application " | MEDICATED_PAD | CUTANEOUS | Status: DC | PRN

## 2021-02-22 MED ORDER — MISOPROSTOL 50MCG HALF TABLET
50.0000 ug | ORAL_TABLET | ORAL | Status: DC | PRN
  Administered 2021-02-22: 50 ug via BUCCAL
  Filled 2021-02-22: qty 1

## 2021-02-22 NOTE — MAU Note (Signed)
Pt presents to MAU c/o ctx every 5 min, no LOF or vaginal bleeding.  Pt endorses +fm.  Pt states that she has not had an SVE or GBS test yet.

## 2021-02-22 NOTE — Anesthesia Preprocedure Evaluation (Signed)
Anesthesia Evaluation  Patient identified by MRN, date of birth, ID band Patient awake    Reviewed: Allergy & Precautions, Patient's Chart, lab work & pertinent test results  Airway Mallampati: II  TM Distance: >3 FB Neck ROM: Full    Dental no notable dental hx.    Pulmonary Current Smoker,    Pulmonary exam normal breath sounds clear to auscultation       Cardiovascular negative cardio ROS Normal cardiovascular exam Rhythm:Regular Rate:Normal     Neuro/Psych negative neurological ROS  negative psych ROS   GI/Hepatic negative GI ROS, Neg liver ROS,   Endo/Other  Obesity BMI 37  Renal/GU negative Renal ROS  negative genitourinary   Musculoskeletal negative musculoskeletal ROS (+)   Abdominal (+) + obese,   Peds  Hematology hct 35.5, plt 165   Anesthesia Other Findings   Reproductive/Obstetrics (+) Pregnancy                             Anesthesia Physical Anesthesia Plan  ASA: 2  Anesthesia Plan: Epidural   Post-op Pain Management:    Induction:   PONV Risk Score and Plan: 2  Airway Management Planned: Natural Airway  Additional Equipment: None  Intra-op Plan:   Post-operative Plan:   Informed Consent: I have reviewed the patients History and Physical, chart, labs and discussed the procedure including the risks, benefits and alternatives for the proposed anesthesia with the patient or authorized representative who has indicated his/her understanding and acceptance.       Plan Discussed with:   Anesthesia Plan Comments:         Anesthesia Quick Evaluation

## 2021-02-22 NOTE — Lactation Note (Signed)
This note was copied from a baby's chart. Lactation Consultation Note  Patient Name: Dawn Moody STMHD'Q Date: 02/22/2021 Reason for consult: L&D Initial assessment Age:25 hours  P1, Baby latched upon entering with intermittent swallows and lips flanged. Feed on demand with cues.  Goal 8-12+ times per day after first 24 hrs.  Place baby STS if not cueing.  Lactation to follow up on MBU.  Maternal Data Does the patient have breastfeeding experience prior to this delivery?: No  LATCH Score Latch: Grasps breast easily, tongue down, lips flanged, rhythmical sucking.  Audible Swallowing: A few with stimulation  Type of Nipple: Everted at rest and after stimulation  Comfort (Breast/Nipple): Soft / non-tender  Hold (Positioning): Assistance needed to correctly position infant at breast and maintain latch.  LATCH Score: 8    Interventions Interventions: Skin to skin;Education   Consult Status Consult Status: Follow-up from L&D    Dawn Moody [facility] 02/22/2021, 6:59 PM

## 2021-02-22 NOTE — H&P (Signed)
OBSTETRIC ADMISSION HISTORY AND PHYSICAL  Dawn Moody is a 25 y.o. female G2P0010 with IUP at 21w1dby LMP presenting for IOL due to elevated BP. She reports +FMs, No LOF, no VB, no blurry vision, headaches or peripheral edema, and RUQ pain.  She plans on breast feeding. She will use condoms for birth control. 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, received PCN G 2.4 mi U during admission on 09/03/2020 with further PCN at HD)  GBS status unknown  Rubella non-immune status, antepartum Tobacco and THC use during pregnancy (reports few cigarettes weekly)    Past Medical History: Past Medical History:  Diagnosis Date   Arthritis    Obesity     Past Surgical History: Past Surgical History:  Procedure Laterality Date   TYMPANOSTOMY TUBE PLACEMENT      Obstetrical History: OB History     Gravida  2   Para  0   Term  0   Preterm  0   AB  1   Living  0      SAB  1   IAB  0   Ectopic  0   Multiple  0   Live Births  0           Social History Social History   Socioeconomic History   Marital status: Single    Spouse name: Not on file   Number of children: Not on file   Years of education: Not on file   Highest education level: Not on file  Occupational History   Not on file  Tobacco Use   Smoking status: Every Day    Packs/day: 2.00    Types: Cigarettes   Smokeless tobacco: Current   Tobacco comments:    quit with pregnancy   Vaping Use   Vaping Use: Former   Substances: Nicotine  Substance and Sexual Activity   Alcohol use: Not Currently    Comment: occ prior to pregnancy    Drug use: No   Sexual activity: Yes    Birth control/protection: None  Other Topics Concern   Not on file  Social  History Narrative   Not on file   Social Determinants of Health   Financial Resource Strain: Not on file  Food Insecurity: No Food Insecurity   Worried About Running Out of Food in the Last Year: Never true   Ran Out of Food in the Last Year: Never true  Transportation Needs: No Transportation Needs   Lack of Transportation (Medical): No   Lack of Transportation (Non-Medical): No  Physical Activity: Not on file  Stress: Not on file  Social Connections: Not on file    Family History: Family History  Problem Relation Age of Onset   Hypertension Mother    Breast cancer Mother    Cancer Mother    Hypertension Father    Diabetes Father    Cancer Father     Allergies: No Known Allergies  Medications Prior to Admission  Medication Sig Dispense Refill Last Dose   prenatal vitamin w/FE, FA (PRENATAL 1 + 1) 27-1 MG TABS tablet Take 1 tablet by mouth daily at 12 noon. 30 tablet 11 Past Week   pyridOXINE (VITAMIN B-6) 100 MG tablet Take 100 mg  by mouth daily.   Past Week   aspirin EC 81 MG tablet Take 81 mg by mouth daily. Swallow whole.      doxylamine, Sleep, (UNISOM) 25 MG tablet Take 25 mg by mouth at bedtime as needed.      ondansetron (ZOFRAN ODT) 4 MG disintegrating tablet Take 1 tablet (4 mg total) by mouth every 8 (eight) hours as needed for nausea or vomiting. 20 tablet 0      Review of Systems   All systems reviewed and negative except as stated in HPI  Blood pressure 136/89, pulse 75, last menstrual period 05/24/2020. General appearance: alert, cooperative, and no distress Lungs: clear to auscultation bilaterally Heart: regular rate and rhythm Abdomen: soft, non-tender; bowel sounds normal Extremities: Homans sign is negative, no sign of DVT Presentation: cephalic Fetal monitoringBaseline: 130 bpm, Variability: Good {> 6 bpm), and Accelerations: Reactive Uterine activity irritability  Dilation: 1 Effacement (%): 50, 60 Station: -3 Exam by:: Macy Mis,  RN   Prenatal labs: ABO, Rh: O/Positive/-- (02/21 1539) Antibody: Negative (02/21 1539) Rubella: <0.90 (02/21 1539) RPR: Reactive (06/06 0942)  HBsAg: Negative (02/21 1539)  HIV: Non Reactive (06/06 0942)  GBS:   unknown 1 hr Glucola normal (133) Genetic screening  NIPS low risk Anatomy US normal  Prenatal Transfer Tool  Maternal Diabetes: No Genetic Screening: Normal Maternal Ultrasounds/Referrals: Other: For follow up of growth due to marginal insertion of umbilical cord (Normal interval growth with measurements consistent with dates seen on ultrasound) Fetal Ultrasounds or other Referrals:  None Maternal Substance Abuse:  Yes:  Type: Smoker Significant Maternal Medications:  None Significant Maternal Lab Results: Other: GBS status unknown, 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  Patient Active Problem List   Diagnosis Date Noted   Indication for care in labor or delivery 02/22/2021   Obesity affecting pregnancy 12/13/2020   Marginal insertion of umbilical cord affecting management of mother 11/11/2020   Elevated blood pressure affecting pregnancy, antepartum 10/04/2020   Nausea and vomiting in pregnancy 10/04/2020   Tobacco abuse 10/04/2020   Obesity    History of penicillin allergy 09/03/2020   History of syphilis 09/02/2020   Rubella non-immune status, antepartum 09/01/2020   Supervision of high risk pregnancy, antepartum 08/30/2020   Family history of breast cancer in mother 08/30/2020    Assessment/Plan:  Dawn Moody is a 25 y.o. G2P0010 at 58w1dhere for IOL due to elevated BP.  #Labor: Start induction with cytotec.  #Pain: IV fent, epidural when needed #FWB: Cat 1  #ID:  GBS status unknown, will collect PCR. Term with no known risk factors, no prophylaxis unless positive result.  #MOF: breast #MOC: Declines birth controls  postpartum, states she will use condoms #Circ:  Yes, need to consent  #Rubella non-immune: will need MMR postpartum  #Latent Syphilis: Treated with PCN in 08/2020. Asymptomatic with no lesions. F/u RPR and titers.   #Gestational hypertension: No severe ranges and asymptomatic. Pre-e labs WNL. Cont to monitor.   #Tobacco and THC use: SW consult pp   GTyna Jaksch Medical Student  02/22/2021, 6:26 AM  GME ATTESTATION:  I saw and evaluated the patient. I agree with the findings and the plan of care as documented in the resident's note with my edits added.  SDarrelyn Hillock DO OB Fellow, [place]8/16/2022 1:51 PM

## 2021-02-22 NOTE — Progress Notes (Addendum)
Labor Progress Note Dawn Moody is a 25 y.o. G2P0010 at [redacted]w[redacted]d presented for IOL due to gestational HTN.    O:  BP (!) 140/101   Pulse 68   Temp 98.6 F (37 C) (Oral)   Resp 18   Ht 5\' 9"  (1.753 m)   Wt 114.8 kg   LMP 05/24/2020   BMI 37.36 kg/m  EFM: 125/mod/15x15/no decels  CVE: Dilation: 7 Effacement (%): 90 Cervical Position: Posterior Station: -1 Presentation: Vertex Exam by:: H.Price, RN   A&P: 25 y.o. G2P0010 [redacted]w[redacted]d. #Labor: Progressing well, transitioned to pit 2x2 around 1215 today. Will continue to titrate as needed. Hope for VD.  #Pain: Epidural  #FWB: Cat 1 #GBS unknown (at term with no risk factors), GBS swab pending.   #GHTN: Asymptomatic. Pre-e labs WNL. Cont to monitor.  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 Treated in 08/2020.   09/2020, DO 5:01 PM

## 2021-02-22 NOTE — MAU Provider Note (Signed)
Patient was assessed for active labor and managed by nursing staff (with resident supervision) during this encounter. I have reviewed the chart and the NST for appropriate reactivity. Upon reviewing the chart, her BPs have remained elevated throughout her MAU stay. Consulted with Dr. Mathis Fare (OB Fellow) who agreed that due to patient's gestational age and elevated BP, pt should be admitted for IOL. Will put in admission/IOL orders.  Edd Arbour, CNM, MSN, IBCLC Certified Nurse Midwife, [facility]

## 2021-02-22 NOTE — Anesthesia Procedure Notes (Signed)
Epidural Patient location during procedure: OB Start time: 02/22/2021 11:38 AM End time: 02/22/2021 11:56 AM  Staffing Anesthesiologist: Lannie Fields, DO Performed: anesthesiologist   Preanesthetic Checklist Completed: patient identified, IV checked, risks and benefits discussed, monitors and equipment checked, pre-op evaluation and timeout performed  Epidural Patient position: sitting Prep: DuraPrep and site prepped and draped Patient monitoring: continuous pulse ox, blood pressure, heart rate and cardiac monitor Approach: midline Location: L3-L4 Injection technique: LOR air  Needle:  Needle type: Tuohy  Needle gauge: 17 G Needle length: 9 cm Needle insertion depth: 7 cm Catheter type: closed end flexible Catheter size: 19 Gauge Catheter at skin depth: 12 cm Test dose: negative  Assessment Sensory level: T8 Events: blood not aspirated, injection not painful, no injection resistance, no paresthesia and negative IV test  Additional Notes Patient identified. Risks/Benefits/Options discussed with patient including but not limited to bleeding, infection, nerve damage, paralysis, failed block, incomplete pain control, headache, blood pressure changes, nausea, vomiting, reactions to medication both or allergic, itching and postpartum back pain. Confirmed with bedside nurse the patient's most recent platelet count. Confirmed with patient that they are not currently taking any anticoagulation, have any bleeding history or any family history of bleeding disorders. Patient expressed understanding and wished to proceed. All questions were answered. Sterile technique was used throughout the entire procedure. Please see nursing notes for vital signs. Test dose was given through epidural catheter and negative prior to continuing to dose epidural or start infusion. Warning signs of high block given to the patient including shortness of breath, tingling/numbness in hands, complete motor  block, or any concerning symptoms with instructions to call for help. Patient was given instructions on fall risk and not to get out of bed. All questions and concerns addressed with instructions to call with any issues or inadequate analgesia.  Reason for block:procedure for pain

## 2021-02-22 NOTE — Progress Notes (Addendum)
Labor Progress Note Dawn Moody is a 25 y.o. G2P0010 at [redacted]w[redacted]d presented for IOL due to gestational HTN.   S: Doing well, feeling contractions. Using IV fent for relief.   O:  BP (!) 152/92   Pulse 61   Temp 98.2 F (36.8 C) (Oral)   Resp 18   Ht 5\' 9"  (1.753 m)   Wt 114.8 kg   LMP 05/24/2020   BMI 37.36 kg/m  EFM: 130/mod/15x15  CVE: Dilation: 5 Effacement (%): 90 Cervical Position: Posterior Station: -1 Presentation: Vertex Exam by:: H.Price, RN   A&P: 25 y.o. G2P0010 [redacted]w[redacted]d. #Labor: Progressing well. On recent check, 3/90, started on pit at 1215.  #Pain: IV fent  #FWB: Cat 1  #GBS unknown, however at term with no risk factors. No treatment indicated, GBS swab pending.   #GHTN: BP >140/90 over 4 hours apart, admitted from MAU with elevated pressures without severe range. Asymptomatic. Pre-e labs WNL. Cont to monitor.  4/90, DO 12:49 PM

## 2021-02-23 ENCOUNTER — Other Ambulatory Visit (HOSPITAL_COMMUNITY): Payer: Self-pay

## 2021-02-23 MED ORDER — IBUPROFEN 600 MG PO TABS
600.0000 mg | ORAL_TABLET | Freq: Four times a day (QID) | ORAL | 0 refills | Status: DC
  Filled 2021-02-23: qty 30, 8d supply, fill #0

## 2021-02-23 MED ORDER — NIFEDIPINE ER OSMOTIC RELEASE 30 MG PO TB24
30.0000 mg | ORAL_TABLET | Freq: Every day | ORAL | Status: DC
  Administered 2021-02-23 – 2021-02-24 (×2): 30 mg via ORAL
  Filled 2021-02-23 (×2): qty 1

## 2021-02-23 MED ORDER — NIFEDIPINE ER 30 MG PO TB24
30.0000 mg | ORAL_TABLET | Freq: Every day | ORAL | 2 refills | Status: DC
  Filled 2021-02-23: qty 30, 30d supply, fill #0

## 2021-02-23 NOTE — Discharge Summary (Addendum)
Postpartum Discharge Summary     Patient Name: Dawn Moody DOB: 05-01-1996 MRN: [id number]  Date of admission: 02/22/2021 Delivery date:02/22/2021  Delivering provider: Patriciaann Clan  Date of discharge: 02/24/2021  Admitting diagnosis: Indication for care in labor or delivery [O75.9] Intrauterine pregnancy: [redacted]w[redacted]d    Secondary diagnosis:  Active Problems:   Indication for care in labor or delivery   SVD (spontaneous vaginal delivery)  Additional problems: GHTN ; rubella nonimmune; history of syphillis (still awaiting Tpal results upon discharge)   Discharge diagnosis: Term Pregnancy Delivered                                              Post partum procedures: none Augmentation: Pitocin and Cytotec Complications: None  Hospital course: Induction of Labor With Vaginal Delivery   25y.o. yo G2P0010 at 391w2das admitted to the hospital 02/22/2021 for induction of labor.  Indication for induction: Gestational hypertension (neg labs).  Patient had an uncomplicated labor course as follows: Membrane Rupture Time/Date: 12:44 PM ,02/22/2021   Delivery Method:Vaginal, Spontaneous  Episiotomy: None  Lacerations:  2nd degree;Periurethral  Details of delivery can be found in separate delivery note.  Patient had a postpartum course remarkable for being started on Procardia XL 3038mn PPD#1 due to mild range BP elevations. Patient is discharged home 02/24/21 and will have a BP check in 1 week.  Newborn Data: Birth date:02/22/2021  Birth time:5:55 PM  Gender:Female  Living status:Living  Apgars:8 ,9  Weight:4026 g (8lb 14oz)  Magnesium Sulfate received: No BMZ received: No Rhophylac:No MMR:Yes (recommended prior to d/c) T-DaP:Given prenatally Flu: No Transfusion:No  Physical exam  Vitals:   02/23/21 0528 02/23/21 1751 02/23/21 1942 02/24/21 0545  BP: (!) 142/99 (!) 133/91 133/87 135/90  Pulse: 73 73 88 78  Resp: 18 18 18 17   Temp: 98.2 F (36.8 C) 98.8 F (37.1 C) 98.8 F  (37.1 C) 98.7 F (37.1 C)  TempSrc: Oral Oral Oral Oral  SpO2: 100% 100% 100% 100%  Weight:      Height:       General: alert, cooperative, and no distress Lochia: appropriate Uterine Fundus: firm Incision: N/A DVT Evaluation: No evidence of DVT seen on physical exam. Labs: Lab Results  Component Value Date   WBC 7.1 02/22/2021   HGB 12.0 02/22/2021   HCT 35.5 (L) 02/22/2021   MCV 97.8 02/22/2021   PLT 165 02/22/2021   CMP Latest Ref Rng & Units 02/22/2021  Glucose 70 - 99 mg/dL 83  BUN 6 - 20 mg/dL 6  Creatinine 0.44 - 1.00 mg/dL 0.72  Sodium 135 - 145 mmol/L 136  Potassium 3.5 - 5.1 mmol/L 3.7  Chloride 98 - 111 mmol/L 106  CO2 22 - 32 mmol/L 20(L)  Calcium 8.9 - 10.3 mg/dL 9.2  Total Protein 6.5 - 8.1 g/dL 6.6  Total Bilirubin 0.3 - 1.2 mg/dL 0.8  Alkaline Phos 38 - 126 U/L 124  AST 15 - 41 U/L 17  ALT 0 - 44 U/L 11   Edinburgh Score: Edinburgh Postnatal Depression Scale Screening Tool 02/23/2021  I have been able to laugh and see the funny side of things. 0  I have looked forward with enjoyment to things. 0  I have blamed myself unnecessarily when things went wrong. 1  I have been anxious or worried for no good reason. 1  I have felt scared or panicky for no good reason. 0  Things have been getting on top of me. 0  I have been so unhappy that I have had difficulty sleeping. 0  I have felt sad or miserable. 0  I have been so unhappy that I have been crying. 0  The thought of harming myself has occurred to me. 0  Edinburgh Postnatal Depression Scale Total 2     After visit meds:  Allergies as of 02/24/2021   No Known Allergies      Medication List     TAKE these medications    calcium carbonate 500 MG chewable tablet Commonly known as: TUMS - dosed in mg elemental calcium Chew 3 tablets by mouth daily as needed for indigestion or heartburn.   doxylamine (Sleep) 25 MG tablet Commonly known as: UNISOM Take 25 mg by mouth at bedtime as needed  (nausea).   ibuprofen 600 MG tablet Commonly known as: ADVIL Take 1 tablet (600 mg total) by mouth every 6 (six) hours.   NIFEdipine 30 MG 24 hr tablet Commonly known as: ADALAT CC Take 1 tablet (30 mg total) by mouth daily.   ondansetron 4 MG disintegrating tablet Commonly known as: Zofran ODT Take 1 tablet (4 mg total) by mouth every 8 (eight) hours as needed for nausea or vomiting.   polyethylene glycol 17 g packet Commonly known as: MIRALAX / GLYCOLAX Take 17 g by mouth daily as needed for mild constipation.   prenatal vitamin w/FE, FA 27-1 MG Tabs tablet Take 1 tablet by mouth daily at 12 noon.   pyridOXINE 100 MG tablet Commonly known as: VITAMIN B-6 Take 100 mg by mouth daily.         Discharge home in stable condition Infant Feeding: Breast Infant Disposition:home with mother Discharge instruction: per After Visit Summary and Postpartum booklet. Activity: Advance as tolerated. Pelvic rest for 6 weeks.  Diet: low salt diet Future Appointments:No future appointments. Follow up Visit:  [facility] [place] at [facility]. Go in 4 week(s).   Specialty: Obstetrics and Gynecology Why: In 4-6 weeks for postpartum visit Contact information: [place] [id number] [phone]                Message sent to [facility] on 02/23/21:  Please schedule this patient for a In person postpartum visit in 4 weeks with the following provider: APP. Additional Postpartum F/U:BP check 1 week  High risk pregnancy complicated by: HTN Delivery mode:  Vaginal, Spontaneous  Anticipated Birth Control:  Condoms   02/24/2021 Pearla Dubonnet, MD  CNM attestation I have seen and examined this patient and agree with above documentation in the resident's note.   Keyarah Mcroy is a 25 y.o. G2P0010 s/p vag del.   Pain is well controlled.  Plan for birth control is condoms.  Method of Feeding:  breast  She desires a circ for her son; she was consented and a note placed on the infant's chart.  PE:  BP 135/90 (BP Location: Left Arm)   Pulse 78   Temp 98.7 F (37.1 C) (Oral)   Resp 17   Ht 5' 9"  (1.753 m)   Wt 114.8 kg   LMP 05/24/2020   SpO2 100%   BMI 37.36 kg/m  Fundus firm  Recent Labs    02/22/21 0610  HGB 12.0  HCT 35.5*     Plan: discharge today - postpartum care  discussed - f/u clinic in 1 week for BP check, then 6 weeks for postpartum visit   Myrtis Ser, CNM 10:08 AM 02/24/2021

## 2021-02-23 NOTE — Progress Notes (Addendum)
Post Partum Day 1 Subjective: Ms. Dawn Moody endorses feeling well this morning with minimal pain. She is ambulating independently and is not having any problems urinating. She has not passed flatus or had a bowel movement and states she is worried due to the stitches she had for her tears. She is aware of her BP elevations and states she has had them her "whole life."   Objective: Blood pressure (!) 142/99, pulse 73, temperature 98.2 F (36.8 C), temperature source Oral, resp. rate 18, height 5' 9" (1.753 m), weight 114.8 kg, last menstrual period 05/24/2020, SpO2 100 %.  Physical Exam:  General: alert and no distress Lochia: appropriate Uterine Fundus: firm Incision: N/A DVT Evaluation: No cords or calf tenderness. No significant calf/ankle edema.  Recent Labs    02/22/21 0610  HGB 12.0  HCT 35.5*    Assessment/Plan: PPD #1 - Continue stool softener to encourage bowel movement  - Pain well managed  - Lochia WNL  - Needs SW consult due to tobacco use in pregnancy   Elevated BP - No severe range pressures or symptoms  - Patient says she has had elevated pressures her "whole life" so unknown whether this is gHTN or cHTN  - Nifedipine 30 mg daily started, will be sent home with BP medications - Preeclampsia labs WNL on admission  - Needs 1 week PP BP check   GBS unknown  - GBS culture pending - Received penicillin for GBS prophylaxis prior to delivery   Rubella nonimmune  - Will give MMR today   Latent Syphilis  - T. Pallidum Ab pending  - Patient's baseline titer is 1:4  Contraception: condoms  Feeding: breast  Baby boy: circ consented, needs to be done today    LOS: 1 day   Emily Griffith 02/23/2021, 7:55 AM   Midwife attestation Post Partum Day 1 I have seen and examined this patient and agree with above documentation in the student's note.   Dawn Moody is a 25 y.o. G2P0010 s/p SVD.  Pt denies problems with ambulating, voiding or po intake. Pain is well  controlled.  Plan for birth control is condoms.  Method of Feeding: breast  PE:  BP (!) 142/99 (BP Location: Left Arm)   Pulse 73   Temp 98.2 F (36.8 C) (Oral)   Resp 18   Ht 5' 9" (1.753 m)   Wt 253 lb (114.8 kg)   LMP 05/24/2020   SpO2 100%   BMI 37.36 kg/m  Gen: well appearing Heart: reg rate Lungs: normal WOB Fundus firm Ext: soft, no pain, no edema  Plan for discharge: today pending infant discharge  Lisa Leftwich-Kirby, CNM 8:29 AM   

## 2021-02-23 NOTE — Anesthesia Postprocedure Evaluation (Signed)
Anesthesia Post Note  Patient: Dawn Moody  Procedure(s) Performed: AN AD HOC LABOR EPIDURAL     Patient location during evaluation: Mother Baby Anesthesia Type: Epidural Level of consciousness: awake and alert Pain management: pain level controlled Vital Signs Assessment: post-procedure vital signs reviewed and stable Respiratory status: spontaneous breathing, nonlabored ventilation and respiratory function stable Cardiovascular status: stable Postop Assessment: no headache, no backache, epidural receding, no apparent nausea or vomiting, patient able to bend at knees, adequate PO intake and able to ambulate Anesthetic complications: no   No notable events documented.  Last Vitals:  Vitals:   02/23/21 0156 02/23/21 0528  BP: 132/84 (!) 142/99  Pulse: (!) 52 73  Resp: 16 18  Temp: 37.1 C 36.8 C  SpO2: 100% 100%    Last Pain:  Vitals:   02/23/21 0528  TempSrc: Oral  PainSc: 0-No pain   Pain Goal:                   [employer]

## 2021-02-23 NOTE — Clinical Social Work Maternal (Addendum)
CLINICAL SOCIAL WORK MATERNAL/CHILD NOTE  Patient Details  Name: Dawn Moody MRN: [id number] Date of Birth: 1995-12-11  Date:  02/23/2021  Clinical Social Worker Initiating Note:  Kathrin [place], [place] Date/Time: Initiated:  02/23/21/0915     Child's Name:  Steve Rattler III   Biological Parents:  Mother, Father (Father: Steve Rattler, [birth year] 1995)   Need for Interpreter:  None   Reason for Referral:      Address:  [place] [address]    Phone number:  [phone] (home)     Additional phone number:   Household Members/Support Persons (HM/SP):   Household Member/Support Person 1   HM/SP Name Relationship DOB or Age  HM/SP -[place]. Spouse [birth year] 1995  HM/SP -2        HM/SP -3        HM/SP -4        HM/SP -5        HM/SP -6        HM/SP -7        HM/SP -8          Natural Supports (not living in the home):  Extended Family   Professional Supports: None   Employment: Unemployed   Type of Work:     Education:  9 to 11 years   Homebound arranged: No  Financial Resources:  [employer]   Other Resources:  [employer], Physicist, medical     Cultural/Religious Considerations Which May Impact Care:    Strengths:  Ability to meet basic needs  , Home prepared for child     Psychotropic Medications:         Pediatrician:       Pediatrician List:   Geraldine      Pediatrician Fax Number:    Risk Factors/Current Problems:  Substance Use     Cognitive State:  Alert  , Able to Concentrate  , Insightful     Mood/Affect:  Bright  , Calm  , Happy     CSW Assessment: CSW received consult for hx Limited PNC and 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   CSW received consult for late and limited PNC. CSW reviewed chart. MOB received adequate prenatal care as evidenced by MOB started care prior to  28 weeks and had more than 3 visits at [redacted]w[redacted]d [id number] CSW met with MOB at bedside. CSW congratulated MOB and FOB. CSW observed MOB holding the infant and FOB up in the room. MOB presented happy, bright and welcoming of CSW [place]. CSW offered MOB privacy. FOB left room to give MOB privacy. MOB confirmed the demographic information that we have on file is correct. MOB shared it's just her and FOB Dionne PhilMacario Carlsat live in her household. CSW inquired how MOB has felt emotionally since giving birth. MOB expressed, " I am in love. Everything has gone better than I expected." CSW inquired about MOB mental health history. MOB reported she was diagnosed with bipolar, depression and ADHD in middle school and received therapy and medication treatment. MOB reported as an adult she has not had any concerns with her mental health. MOB reported during the pregnancy she was feeling anxious and was referred to a counselor which was helpful. MOB reported she is open to seeing  a therapist ongoing if available. CSW provided education regarding the baby blues period vs. perinatal mood disorders, discussed treatment and gave resources for mental health follow up. MOB was appreciative of the resources. CSW inquired about MOB coping mechanisms. MOB reported she stays to herself and tries to strive through it. CSW asked MOB about her supports. MOB identified FOB as her support. CSW encouraged MOB to ask FOB for help and support during this time if needed. MOB agreeable. CSW recommended MOB complete a self-evaluation during the postpartum time period using the New Mom Checklist from Postpartum Progress and encouraged MOB to contact a medical professional if symptoms are noted at any time. MOB reported she feels comfortable reaching out to her doctor if concerns arise.   CSW inquired if MOB used substances during pregnancy. MOB disclosed she used THC in the beginning of her pregnancy before she knew she was  pregnant and in the middle of her pregnancy at about six months to help gain an appetite because she was not eating. MOB reported she told her doctor about her THC use. CSW informed MOB that infant's UDS resulted positive for THC. CSW explained the hospital drug screen policy and made MOB aware that CSW will make a report to CPS. MOB reported understanding and had no questions.   CSW provided review of Sudden Infant Death Syndrome (SIDS) precautions. MOB reported she has essential items for the infant including bassinet and crib where the infant will safely sleep. MOB shared that she receives food stamp benefits and will apply for [facility]. MOB reported she is still choosing a pediatrician for the infant. CSW assessed MOB for additional needs. MOB reported no further need.   -CSW made a report to [facility] CPS.  CSW identifies no further need for intervention and no barriers to discharge at this time.     CSW Plan/Description:  Sudden Infant Death Syndrome (SIDS) Education, CSW Will Continue to Monitor Umbilical Cord Tissue Drug Screen Results and Make Report if Warranted, No Further Intervention Required/No Barriers to Discharge, Perinatal Mood and Anxiety Disorder (PMADs) Education, [place], Child Protective Service Report      Lia Hopping, LCSW 02/23/2021, 11:59AM

## 2021-02-24 ENCOUNTER — Encounter (HOSPITAL_COMMUNITY): Payer: Self-pay

## 2021-02-24 LAB — CULTURE, BETA STREP (GROUP B ONLY)

## 2021-02-28 ENCOUNTER — Inpatient Hospital Stay (HOSPITAL_COMMUNITY): Admit: 2021-02-28 | Payer: Self-pay

## 2021-03-04 ENCOUNTER — Ambulatory Visit: Payer: Medicaid Other

## 2021-03-04 LAB — T.PALLIDUM AB, TOTAL: T Pallidum Abs: REACTIVE — AB

## 2021-03-05 ENCOUNTER — Telehealth (HOSPITAL_COMMUNITY): Payer: Self-pay

## 2021-03-05 NOTE — Telephone Encounter (Signed)
No answer. Left message to return nurse call.  Marcelino Duster [facility] 03/05/2021,1527

## 2021-03-28 ENCOUNTER — Ambulatory Visit: Payer: Medicaid Other | Admitting: Student

## 2021-10-25 ENCOUNTER — Other Ambulatory Visit (HOSPITAL_COMMUNITY): Payer: Self-pay

## 2021-12-07 ENCOUNTER — Inpatient Hospital Stay (HOSPITAL_COMMUNITY)
Admission: EM | Admit: 2021-12-07 | Discharge: 2021-12-07 | Disposition: A | Discharge status: Home / Self Care | Payer: Medicaid Other | Attending: Obstetrics and Gynecology | Admitting: Obstetrics and Gynecology

## 2021-12-07 ENCOUNTER — Telehealth: Payer: Self-pay | Admitting: Family Medicine

## 2021-12-07 ENCOUNTER — Encounter (HOSPITAL_COMMUNITY): Payer: Self-pay | Admitting: Obstetrics and Gynecology

## 2021-12-07 ENCOUNTER — Other Ambulatory Visit: Payer: Self-pay

## 2021-12-07 ENCOUNTER — Inpatient Hospital Stay (HOSPITAL_COMMUNITY): Payer: Medicaid Other

## 2021-12-07 DIAGNOSIS — Z6740 Type O blood, Rh positive: Secondary | ICD-10-CM | POA: Insufficient documentation

## 2021-12-07 DIAGNOSIS — R9389 Abnormal findings on diagnostic imaging of other specified body structures: Secondary | ICD-10-CM | POA: Diagnosis not present

## 2021-12-07 DIAGNOSIS — O039 Complete or unspecified spontaneous abortion without complication: Secondary | ICD-10-CM | POA: Diagnosis not present

## 2021-12-07 DIAGNOSIS — R109 Unspecified abdominal pain: Secondary | ICD-10-CM | POA: Diagnosis not present

## 2021-12-07 DIAGNOSIS — Z3A11 11 weeks gestation of pregnancy: Secondary | ICD-10-CM | POA: Diagnosis not present

## 2021-12-07 DIAGNOSIS — Z9889 Other specified postprocedural states: Secondary | ICD-10-CM | POA: Diagnosis not present

## 2021-12-07 DIAGNOSIS — N939 Abnormal uterine and vaginal bleeding, unspecified: Secondary | ICD-10-CM

## 2021-12-07 DIAGNOSIS — O26891 Other specified pregnancy related conditions, first trimester: Secondary | ICD-10-CM | POA: Diagnosis not present

## 2021-12-07 DIAGNOSIS — R110 Nausea: Secondary | ICD-10-CM | POA: Insufficient documentation

## 2021-12-07 DIAGNOSIS — O209 Hemorrhage in early pregnancy, unspecified: Secondary | ICD-10-CM | POA: Diagnosis not present

## 2021-12-07 DIAGNOSIS — B9689 Other specified bacterial agents as the cause of diseases classified elsewhere: Secondary | ICD-10-CM | POA: Diagnosis not present

## 2021-12-07 DIAGNOSIS — O020 Blighted ovum and nonhydatidiform mole: Secondary | ICD-10-CM | POA: Diagnosis not present

## 2021-12-07 LAB — CBC WITH DIFFERENTIAL/PLATELET
Abs Immature Granulocytes: 0.01 10*3/uL (ref 0.00–0.07)
Basophils Absolute: 0 10*3/uL (ref 0.0–0.1)
Basophils Relative: 1 %
Eosinophils Absolute: 0.1 10*3/uL (ref 0.0–0.5)
Eosinophils Relative: 2 %
HCT: 33.2 % — ABNORMAL LOW (ref 36.0–46.0)
Hemoglobin: 11.3 g/dL — ABNORMAL LOW (ref 12.0–15.0)
Immature Granulocytes: 0 %
Lymphocytes Relative: 42 %
Lymphs Abs: 2.6 10*3/uL (ref 0.7–4.0)
MCH: 31.7 pg (ref 26.0–34.0)
MCHC: 34 g/dL (ref 30.0–36.0)
MCV: 93 fL (ref 80.0–100.0)
Monocytes Absolute: 0.4 10*3/uL (ref 0.1–1.0)
Monocytes Relative: 6 %
Neutro Abs: 3 10*3/uL (ref 1.7–7.7)
Neutrophils Relative %: 49 %
Platelets: 175 10*3/uL (ref 150–400)
RBC: 3.57 MIL/uL — ABNORMAL LOW (ref 3.87–5.11)
RDW: 12.6 % (ref 11.5–15.5)
WBC: 6.2 10*3/uL (ref 4.0–10.5)
nRBC: 0 % (ref 0.0–0.2)

## 2021-12-07 LAB — ABO/RH: ABO/RH(D): O POS

## 2021-12-07 LAB — HCG, QUANTITATIVE, PREGNANCY: hCG, Beta Chain, Quant, S: 5434 m[IU]/mL — ABNORMAL HIGH (ref <5)

## 2021-12-07 MED ORDER — OXYCODONE-ACETAMINOPHEN 5-325 MG PO TABS
2.0000 | ORAL_TABLET | Freq: Once | ORAL | Status: AC
  Administered 2021-12-07: 2 via ORAL
  Filled 2021-12-07: qty 2

## 2021-12-07 NOTE — Telephone Encounter (Signed)
Called patient to inform of appointments, there was no answer to the phone call and the option to leave a voicemail was unavailable so a letter was mailed.

## 2021-12-07 NOTE — Progress Notes (Signed)
Written and verbal d/c instructions given and understanding voiced. 

## 2021-12-07 NOTE — ED Triage Notes (Addendum)
Pt bleeding having vaginal bleeding and lower abdominal pain onset tonight after taking abortion pill on Friday.

## 2021-12-07 NOTE — MAU Note (Signed)
.  Dawn Moody is a 26 y.o. at Unknown here in MAU reporting took abortion pills Friday and started having bleeidng and cramping that day. Awoke this am at 0300 with severe abdominal pain.  LMP: 09/21/21 Onset of complaint: 0300 Pain score: 10 Vitals:   12/07/21 0446 12/07/21 0447  BP:  122/83  Pulse: 71   Resp: 20   Temp: 98.1 F (36.7 C)   SpO2: 100%      FHT:n/a Lab orders placed from triage:

## 2021-12-07 NOTE — MAU Provider Note (Signed)
History     CSN: [id number]  Arrival date and time: 12/07/21 0353   None     Chief Complaint  Patient presents with   Abdominal Pain   Dawn Moody is a 26 y.o. G3P1010 at 9 weeks.  She presents today for Abdominal Pain s/p TAB.  She reports initiating abortion, via tablets, on Friday and has had bleeding.  However, today she has been experiencing intense pain since 0300.  She reports the bleeding has been "pretty heavy, but I have noticed more clotting today." She rates the pain a 10/10 and is constant, while describing it as "a cramp that is not subsiding."  She endorses some nausea, but no vomiting.    OB History     Gravida  3   Para  1   Term  1   Preterm  0   AB  1   Living  0      SAB  1   IAB  0   Ectopic  0   Multiple  0   Live Births  0           Past Medical History:  Diagnosis Date   Arthritis    Obesity     Past Surgical History:  Procedure Laterality Date   TYMPANOSTOMY TUBE PLACEMENT      Family History  Problem Relation Age of Onset   Hypertension Mother    Breast cancer Mother    Cancer Mother    Hypertension Father    Diabetes Father    Cancer Father     Social History   Tobacco Use   Smoking status: Every Day    Packs/day: 2.00    Types: Cigarettes   Smokeless tobacco: Current   Tobacco comments:    quit with pregnancy   Vaping Use   Vaping Use: Former   Substances: Nicotine  Substance Use Topics   Alcohol use: Not Currently    Comment: occ prior to pregnancy    Drug use: No    Allergies: No Known Allergies  Medications Prior to Admission  Medication Sig Dispense Refill Last Dose   ibuprofen (ADVIL) 600 MG tablet Take 1 tablet (600 mg total) by mouth every 6 (six) hours. 30 tablet 0 12/07/2021 at 0330   calcium carbonate (TUMS - DOSED IN MG ELEMENTAL CALCIUM) 500 MG chewable tablet Chew 3 tablets by mouth daily as needed for indigestion or heartburn.      doxylamine, Sleep, (UNISOM) 25 MG tablet Take 25  mg by mouth at bedtime as needed (nausea).      NIFEdipine (ADALAT CC) 30 MG 24 hr tablet Take 1 tablet (30 mg total) by mouth daily. 30 tablet 2    ondansetron (ZOFRAN ODT) 4 MG disintegrating tablet Take 1 tablet (4 mg total) by mouth every 8 (eight) hours as needed for nausea or vomiting. (Patient not taking: No sig reported) 20 tablet 0    polyethylene glycol (MIRALAX / GLYCOLAX) 17 g packet Take 17 g by mouth daily as needed for mild constipation.      prenatal vitamin w/FE, FA (PRENATAL 1 + 1) 27-1 MG TABS tablet Take 1 tablet by mouth daily at 12 noon. 30 tablet 11    pyridOXINE (VITAMIN B-6) 100 MG tablet Take 100 mg by mouth daily.       Review of Systems  Constitutional:  Negative for chills and fever.  Eyes:  Negative for visual disturbance.  Gastrointestinal:  Positive for abdominal pain and nausea. Negative  for constipation, diarrhea and vomiting.  Genitourinary:  Positive for vaginal bleeding. Negative for difficulty urinating, dysuria and vaginal discharge.  Neurological:  Negative for dizziness, light-headedness and headaches.  Physical Exam   Blood pressure 122/83, pulse 71, temperature 98.1 F (36.7 C), resp. rate 20, height 5\' 9"  (1.753 m), weight 100.2 kg, last menstrual period 09/21/2021, SpO2 100 %, unknown if currently breastfeeding.  Physical Exam Vitals reviewed. Exam conducted with a chaperone present.  Constitutional:      General: She is in acute distress.     Appearance: She is well-developed.  HENT:     Head: Normocephalic and atraumatic.  Eyes:     Conjunctiva/sclera: Conjunctivae normal.  Cardiovascular:     Rate and Rhythm: Normal rate.  Pulmonary:     Effort: Pulmonary effort is normal. No respiratory distress.  Abdominal:     Palpations: Abdomen is soft.     Tenderness: There is no abdominal tenderness.  Genitourinary:    Vagina: Bleeding present.     Comments: Speculum Exam: -Normal External Genitalia: Non tender, Blood noted at introitus.   -Vaginal Vault: Pink mucosa with good rugae. Moderate amt blood removed with faux swab x 4 - -Cervix:Pink, no lesions, cysts, or polyps.  Appears open with suspected POC from os.  Able to tease out lime sized sac with ring forceps.  Bleeding minimal -Bimanual Exam:  Deferred Musculoskeletal:        General: Normal range of motion.     Cervical back: Normal range of motion.  Skin:    General: Skin is warm and dry.  Neurological:     Mental Status: She is alert and oriented to person, place, and time.  Psychiatric:        Mood and Affect: Mood normal.        Behavior: Behavior normal.    MAU Course  Procedures Results for orders placed or performed during the hospital encounter of 12/07/21 (from the past 24 hour(s))  CBC with Differential     Status: Abnormal   Collection Time: 12/07/21  4:30 AM  Result Value Ref Range   WBC 6.2 4.0 - 10.5 K/uL   RBC 3.57 (L) 3.87 - 5.11 MIL/uL   Hemoglobin 11.3 (L) 12.0 - 15.0 g/dL   HCT 12/09/21 (L) 69.4 - 85.4 %   MCV 93.0 80.0 - 100.0 fL   MCH 31.7 26.0 - 34.0 pg   MCHC 34.0 30.0 - 36.0 g/dL   RDW 62.7 03.5 - 00.9 %   Platelets 175 150 - 400 K/uL   nRBC 0.0 0.0 - 0.2 %   Neutrophils Relative % 49 %   Neutro Abs 3.0 1.7 - 7.7 K/uL   Lymphocytes Relative 42 %   Lymphs Abs 2.6 0.7 - 4.0 K/uL   Monocytes Relative 6 %   Monocytes Absolute 0.4 0.1 - 1.0 K/uL   Eosinophils Relative 2 %   Eosinophils Absolute 0.1 0.0 - 0.5 K/uL   Basophils Relative 1 %   Basophils Absolute 0.0 0.0 - 0.1 K/uL   Immature Granulocytes 0 %   Abs Immature Granulocytes 0.01 0.00 - 0.07 K/uL  ABO/Rh     Status: None   Collection Time: 12/07/21  4:30 AM  Result Value Ref Range   ABO/RH(D) O POS    No rh immune globuloin      NOT A RH IMMUNE GLOBULIN CANDIDATE, PT RH POSITIVE Performed at [facility], [address] [place], [place]    [address] OB  Transvaginal  Result Date: 12/07/2021 CLINICAL DATA:  26 year old female with bleeding and cramping  following medication abortion. Estimated gestational age by LMP 11 weeks and 0 days. EXAM: TRANSVAGINAL OB ULTRASOUND TECHNIQUE: Transvaginal ultrasound was performed for complete evaluation of the gestation as well as the maternal uterus, adnexal regions, and pelvic cul-de-sac. COMPARISON:  None relevant; CT Abdomen and Pelvis 05/02/2019 FINDINGS: Intrauterine gestational sac: None. Maternal uterus/adnexae: Heterogeneous, mixed echogenic appearing material in the lower endometrial canal up to 20 mm in thickness (image 13). This area is hypovascular on color Doppler (image 16). Elsewhere the endometrium is thickened up to 18 mm. No pelvic free fluid. The right ovary is normal measuring 1.7 x 3.0 x 1.6 cm. The left ovary is normal measuring 2.9 x 1.7 x 1.7 cm. There is echogenic debris within the urinary bladder on image 8. IMPRESSION: 1. No IUP, adnexal mass, or pelvic free fluid. Heterogeneous, hypovascular material in the lower endometrial canal is probably abortion in progress in this setting. 2. Urinary debris within the bladder.  Correlate with urinalysis. Electronically Signed   By: Odessa Fleming M.D.   On: 12/07/2021 05:52    MDM Pelvic Exam Ultrasound  Pain Medication Assessment and Plan  26 year old G3P1010 at 9 weeks Therapeutic Abortion O Positive  -Reviewed POC with patient. -Exam performed and findings discussed.  -Suspected Products sent to pathology. -Patient reports improvement in pain.  -Patient offered and accepts pain medication. -Percocet 2 tabs ordered. -Send for [place] -Discussed possible [place] outcomes, treatment, and follow-up. -No questions.  -Monitor and await results.   Cherre Robins 12/07/2021, 5:01 AM   Reassessment (6:18 AM)  -[place] results as above. -Patient informed of findings. -Plan to follow up in one week for lab only visit and then in two weeks for F/U with provider. -Message sent to [facility] to schedule accordingly. -Bleeding Precautions Reviewed. -Encouraged to  call primary office or return to MAU if symptoms worsen or with the onset of new symptoms. -Discharged to home in stable condition.  Cherre Robins MSN, CNM Advanced Practice Provider, [facility] [employer] [facility]

## 2021-12-07 NOTE — ED Provider Triage Note (Signed)
Emergency Medicine Provider Triage Evaluation Note  Dawn Moody is a 26 y.o. female, presents to the ED with pelvic cramping and vaginal bleeding since Friday after taking abortion tablet, significantly worsened tonight prompting ED visit. No specific alleviating/aggravating factors.   Review of  Systems  Positive: vaginal bleeding, abdominal pain.  Negative: dizziness, lightheadedness, syncope, chest pain, dyspnea  Physical Exam  BP (!) 123/91 (BP Location: Right Arm)   Pulse 89   Temp 97.7 F (36.5 C) (Oral)   Resp 17   SpO2 100%  General: Awake HEENT: Atraumatic  Resp: Normal effort  Cardiac: Normal rate Abd: No peritoneal signs.  MSK: Moves all extremities without difficulty Neuro: Speech clear  Medical Decision Making  Pt evaluated for pregnancy concern and is stable for transfer to MAU. Pt is in agreement with plan for transfer.  4:21 AM Discussed with MAU APP, Shanda Bumps, who accepts patient in transfer. Will get labs including CBC, hcg quant, and ABO int he ED.   Clinical Impression   1. Vaginal bleeding        Cherly Anderson, PA-C 12/07/21 0423

## 2021-12-08 LAB — SURGICAL PATHOLOGY

## 2021-12-12 ENCOUNTER — Other Ambulatory Visit: Payer: Self-pay | Admitting: *Deleted

## 2021-12-14 ENCOUNTER — Other Ambulatory Visit: Payer: Medicaid Other

## 2021-12-22 ENCOUNTER — Ambulatory Visit: Payer: Self-pay | Admitting: Medical

## 2021-12-26 ENCOUNTER — Ambulatory Visit: Payer: Medicaid Other | Admitting: Obstetrics & Gynecology

## 2022-01-11 ENCOUNTER — Encounter (HOSPITAL_COMMUNITY): Payer: Self-pay | Admitting: Emergency Medicine

## 2022-01-11 ENCOUNTER — Emergency Department (HOSPITAL_COMMUNITY): Payer: Medicaid Other

## 2022-01-11 ENCOUNTER — Emergency Department (HOSPITAL_COMMUNITY)
Admission: EM | Admit: 2022-01-11 | Discharge: 2022-01-12 | Discharge status: Against Medical Advice | Payer: Medicaid Other | Attending: Emergency Medicine | Admitting: Emergency Medicine

## 2022-01-11 ENCOUNTER — Other Ambulatory Visit: Payer: Self-pay

## 2022-01-11 DIAGNOSIS — O034 Incomplete spontaneous abortion without complication: Secondary | ICD-10-CM | POA: Diagnosis not present

## 2022-01-11 DIAGNOSIS — F419 Anxiety disorder, unspecified: Secondary | ICD-10-CM | POA: Diagnosis not present

## 2022-01-11 DIAGNOSIS — O208 Other hemorrhage in early pregnancy: Secondary | ICD-10-CM | POA: Diagnosis not present

## 2022-01-11 DIAGNOSIS — R0789 Other chest pain: Secondary | ICD-10-CM | POA: Diagnosis not present

## 2022-01-11 DIAGNOSIS — R002 Palpitations: Secondary | ICD-10-CM | POA: Insufficient documentation

## 2022-01-11 DIAGNOSIS — R197 Diarrhea, unspecified: Secondary | ICD-10-CM | POA: Insufficient documentation

## 2022-01-11 DIAGNOSIS — R1110 Vomiting, unspecified: Secondary | ICD-10-CM | POA: Insufficient documentation

## 2022-01-11 DIAGNOSIS — R079 Chest pain, unspecified: Secondary | ICD-10-CM | POA: Diagnosis not present

## 2022-01-11 DIAGNOSIS — Z5321 Procedure and treatment not carried out due to patient leaving prior to being seen by health care provider: Secondary | ICD-10-CM | POA: Diagnosis not present

## 2022-01-11 DIAGNOSIS — R0602 Shortness of breath: Secondary | ICD-10-CM | POA: Insufficient documentation

## 2022-01-11 DIAGNOSIS — Z3A01 Less than 8 weeks gestation of pregnancy: Secondary | ICD-10-CM | POA: Diagnosis not present

## 2022-01-11 HISTORY — DX: Essential (primary) hypertension: I10

## 2022-01-11 LAB — CBC WITH DIFFERENTIAL/PLATELET
Abs Immature Granulocytes: 0 10*3/uL (ref 0.00–0.07)
Basophils Absolute: 0 10*3/uL (ref 0.0–0.1)
Basophils Relative: 0 %
Eosinophils Absolute: 0.1 10*3/uL (ref 0.0–0.5)
Eosinophils Relative: 2 %
HCT: 42.9 % (ref 36.0–46.0)
Hemoglobin: 13.9 g/dL (ref 12.0–15.0)
Lymphocytes Relative: 36 %
Lymphs Abs: 2.4 10*3/uL (ref 0.7–4.0)
MCH: 31.4 pg (ref 26.0–34.0)
MCHC: 32.4 g/dL (ref 30.0–36.0)
MCV: 96.8 fL (ref 80.0–100.0)
Monocytes Absolute: 0.1 10*3/uL (ref 0.1–1.0)
Monocytes Relative: 1 %
Neutro Abs: 4.1 10*3/uL (ref 1.7–7.7)
Neutrophils Relative %: 61 %
Platelets: 278 10*3/uL (ref 150–400)
RBC: 4.43 MIL/uL (ref 3.87–5.11)
RDW: 13.3 % (ref 11.5–15.5)
WBC: 6.7 10*3/uL (ref 4.0–10.5)
nRBC: 0 % (ref 0.0–0.2)
nRBC: 0 /100 WBC

## 2022-01-11 LAB — COMPREHENSIVE METABOLIC PANEL
ALT: 17 U/L (ref 0–44)
AST: 20 U/L (ref 15–41)
Albumin: 4.6 g/dL (ref 3.5–5.0)
Alkaline Phosphatase: 60 U/L (ref 38–126)
Anion gap: 11 (ref 5–15)
BUN: 9 mg/dL (ref 6–20)
CO2: 21 mmol/L — ABNORMAL LOW (ref 22–32)
Calcium: 9.6 mg/dL (ref 8.9–10.3)
Chloride: 103 mmol/L (ref 98–111)
Creatinine, Ser: 0.94 mg/dL (ref 0.44–1.00)
GFR, Estimated: >60 mL/min (ref >60)
Glucose, Bld: 95 mg/dL (ref 70–99)
Potassium: 4 mmol/L (ref 3.5–5.1)
Sodium: 135 mmol/L (ref 135–145)
Total Bilirubin: 0.6 mg/dL (ref 0.3–1.2)
Total Protein: 8 g/dL (ref 6.5–8.1)

## 2022-01-11 LAB — I-STAT BETA HCG BLOOD, ED (MC, WL, AP ONLY): I-stat hCG, quantitative: 9.3 m[IU]/mL — ABNORMAL HIGH (ref <5)

## 2022-01-11 NOTE — ED Triage Notes (Addendum)
Pt reports SOB, "heart beating fast", tingling all over.  Pt admits to 1/2 fifth of tequela , cocaine and marijuana today.

## 2022-01-11 NOTE — ED Provider Triage Note (Signed)
Emergency Medicine Provider Triage Evaluation Note  Dawn Moody , a 26 y.o. female  was evaluated in triage.  Pt complains of palpitations, shortness of breath.  Intermittent symptoms for the past 2 to 3 days.  Denies any chest pain.  Reports overall body cramping.  A few weeks ago was having vomiting and diarrhea.  Review of Systems  Positive: Palpitations, shortness of breath Negative: Chest pain  Physical Exam  BP (!) 140/103   Pulse (!) 101   Temp 99.4 F (37.4 C) (Oral)   Resp 16   Ht 5\' 10"  (1.778 m)   Wt 100.2 kg   LMP 09/21/2021   SpO2 98%   BMI 31.70 kg/m  Gen:   Awake, no distress   Resp:  Normal effort  MSK:   Moves extremities without difficulty  Other:  Lungs clear bilaterally  Medical Decision Making  Medically screening exam initiated at 9:59 PM.  Appropriate orders placed.  Dawn Moody was informed that the remainder of the evaluation will be completed by another provider, this initial triage assessment does not replace that evaluation, and the importance of remaining in the ED until their evaluation is complete.  Work-up initiated   Dawn Harry, PA-C 01/11/22 2200

## 2022-01-12 ENCOUNTER — Emergency Department (HOSPITAL_COMMUNITY): Payer: Medicaid Other

## 2022-01-12 ENCOUNTER — Other Ambulatory Visit: Payer: Self-pay

## 2022-01-12 ENCOUNTER — Encounter (HOSPITAL_COMMUNITY): Payer: Self-pay

## 2022-01-12 ENCOUNTER — Emergency Department (HOSPITAL_COMMUNITY)
Admission: EM | Admit: 2022-01-12 | Discharge: 2022-01-13 | Disposition: A | Discharge status: Home / Self Care | Payer: Medicaid Other | Source: Home / Self Care | Attending: Emergency Medicine | Admitting: Emergency Medicine

## 2022-01-12 DIAGNOSIS — R0602 Shortness of breath: Secondary | ICD-10-CM | POA: Diagnosis not present

## 2022-01-12 DIAGNOSIS — F419 Anxiety disorder, unspecified: Secondary | ICD-10-CM | POA: Insufficient documentation

## 2022-01-12 DIAGNOSIS — O034 Incomplete spontaneous abortion without complication: Secondary | ICD-10-CM | POA: Diagnosis not present

## 2022-01-12 DIAGNOSIS — N939 Abnormal uterine and vaginal bleeding, unspecified: Secondary | ICD-10-CM

## 2022-01-12 DIAGNOSIS — Z3A01 Less than 8 weeks gestation of pregnancy: Secondary | ICD-10-CM | POA: Diagnosis not present

## 2022-01-12 DIAGNOSIS — O208 Other hemorrhage in early pregnancy: Secondary | ICD-10-CM | POA: Diagnosis not present

## 2022-01-12 DIAGNOSIS — R0789 Other chest pain: Secondary | ICD-10-CM | POA: Diagnosis not present

## 2022-01-12 DIAGNOSIS — R079 Chest pain, unspecified: Secondary | ICD-10-CM | POA: Diagnosis not present

## 2022-01-12 LAB — WET PREP, GENITAL
Clue Cells Wet Prep HPF POC: NONE SEEN
Sperm: NONE SEEN
Trich, Wet Prep: NONE SEEN
WBC, Wet Prep HPF POC: <10 (ref <10)
Yeast Wet Prep HPF POC: NONE SEEN

## 2022-01-12 LAB — TROPONIN I (HIGH SENSITIVITY)
Troponin I (High Sensitivity): 3 ng/L (ref <18)
Troponin I (High Sensitivity): 3 ng/L (ref <18)

## 2022-01-12 LAB — BASIC METABOLIC PANEL
Anion gap: 8 (ref 5–15)
BUN: 14 mg/dL (ref 6–20)
CO2: 24 mmol/L (ref 22–32)
Calcium: 9.7 mg/dL (ref 8.9–10.3)
Chloride: 108 mmol/L (ref 98–111)
Creatinine, Ser: 0.98 mg/dL (ref 0.44–1.00)
GFR, Estimated: >60 mL/min (ref >60)
Glucose, Bld: 83 mg/dL (ref 70–99)
Potassium: 3.9 mmol/L (ref 3.5–5.1)
Sodium: 140 mmol/L (ref 135–145)

## 2022-01-12 LAB — CBC
HCT: 42.3 % (ref 36.0–46.0)
Hemoglobin: 14.1 g/dL (ref 12.0–15.0)
MCH: 32.3 pg (ref 26.0–34.0)
MCHC: 33.3 g/dL (ref 30.0–36.0)
MCV: 96.8 fL (ref 80.0–100.0)
Platelets: 261 10*3/uL (ref 150–400)
RBC: 4.37 MIL/uL (ref 3.87–5.11)
RDW: 13.5 % (ref 11.5–15.5)
WBC: 3.7 10*3/uL — ABNORMAL LOW (ref 4.0–10.5)
nRBC: 0 % (ref 0.0–0.2)

## 2022-01-12 LAB — I-STAT BETA HCG BLOOD, ED (MC, WL, AP ONLY): I-stat hCG, quantitative: 9.3 m[IU]/mL — ABNORMAL HIGH (ref <5)

## 2022-01-12 MED ORDER — STERILE WATER FOR INJECTION IJ SOLN
INTRAMUSCULAR | Status: AC
  Administered 2022-01-12: 10 mL
  Filled 2022-01-12: qty 10

## 2022-01-12 MED ORDER — METRONIDAZOLE 500 MG PO TABS: 500.0000 mg | ORAL_TABLET | Freq: Two times a day (BID) | ORAL | 0 refills | Status: DC

## 2022-01-12 MED ORDER — DOXYCYCLINE HYCLATE 100 MG PO CAPS: 100.0000 mg | ORAL_CAPSULE | Freq: Two times a day (BID) | ORAL | 0 refills | Status: AC

## 2022-01-12 MED ORDER — SODIUM CHLORIDE 0.9 % IV BOLUS
1000.0000 mL | Freq: Once | INTRAVENOUS | Status: AC
  Administered 2022-01-12: 1000 mL via INTRAVENOUS

## 2022-01-12 MED ORDER — CEFTRIAXONE SODIUM 1 G IJ SOLR
500.0000 mg | Freq: Once | INTRAMUSCULAR | Status: AC
  Administered 2022-01-12: 500 mg via INTRAMUSCULAR
  Filled 2022-01-12: qty 10

## 2022-01-12 NOTE — ED Provider Triage Note (Signed)
Emergency Medicine Provider Triage Evaluation Note  Dawn Moody , a 26 y.o. female  was evaluated in triage.  Pt complains of chest pain.  Started yesterday, was seen at,.  She has been having chest pain since then, does not endorse illicit drug use.  She is on oral birth control..,  Review of Systems  Per HPI  Physical Exam  BP 136/86 (BP Location: Left Arm)   Pulse 90   Temp 97.7 F (36.5 C) (Oral)   Resp 12   Ht 5\' 10"  (1.778 m)   Wt 100.2 kg   LMP 09/21/2021   SpO2 100%   BMI 31.70 kg/m  Gen:   Awake, no distress   Resp:  Normal effort  MSK:   Moves extremities without difficulty  Other:    Medical Decision Making  Medically screening exam initiated at 7:06 PM.  Appropriate orders placed.  Dawn Moody was informed that the remainder of the evaluation will be completed by another provider, this initial triage assessment does not replace that evaluation, and the importance of remaining in the ED until their evaluation is complete.     Burnett Harry, PA-C 01/12/22 1906

## 2022-01-12 NOTE — ED Notes (Signed)
Patient left without being seen.

## 2022-01-12 NOTE — ED Triage Notes (Addendum)
Patient states she has been having heart palpitations, SOB, and dizziness x 3-4 days.  Patient was seen at [facility] ED yesterday for the same and states she had drank tequila, used marijuana and cocaine.

## 2022-01-12 NOTE — Discharge Instructions (Addendum)
The ultrasound today is concerning for retained products of conception.  The gynecology team is planning to set you up with an outpatient D&C.  If you do not hear a phone call tomorrow morning, please call their office to figure out follow-up as soon as possible.  Please take the antibiotics as prescribed.  If you develop any increase in vaginal bleeding, lightheadedness, episodes of passing out, increased shortness of breath, any chest pain or other new concerning symptom, please return for reassessment.

## 2022-01-13 ENCOUNTER — Telehealth: Payer: Self-pay

## 2022-01-13 LAB — GC/CHLAMYDIA PROBE AMP (~~LOC~~) NOT AT ARMC
Chlamydia: NEGATIVE
Comment: NEGATIVE
Comment: NORMAL
Neisseria Gonorrhea: NEGATIVE

## 2022-01-13 LAB — HCG, QUANTITATIVE, PREGNANCY: hCG, Beta Chain, Quant, S: 9 m[IU]/mL — ABNORMAL HIGH (ref <5)

## 2022-01-13 NOTE — Telephone Encounter (Signed)
10:29am, called patient with surgery date, time, location and preop instructions, no answer, left voicemail.  10:45am, chart reviewed by Dr. Shawnie Pons, based on HCG and Ultrasound we cannot rule out if this was a failed termination or a new pregnancy. Dr. Shawnie Pons made appointment in MAU on Sunday 01/15/2022 at 8:30am for a repeat HCG.  11:01am, called patient with updated information. No Answer left voicemail. Explained in voicemail the reason to cancel surgery on Monday 07/10, and gave appointment information for lab visit. Call back number given.  Awaiting returned call from patient.

## 2022-01-13 NOTE — ED Provider Notes (Signed)
[facility]-EMERGENCY DEPT Provider Note   CSN: [id number] Arrival date & time: 01/12/22  1850     History  Chief Complaint  Patient presents with   Palpitations   Dizziness   Anxiety   Shortness of Breath    Dawn Moody is a 26 y.o. female.  Presented to the Emergency Department due to concern for multiple complaints.  She states that she recently drank alcohol, did marijuana, cocaine and also has recently done some ecstasy.  She says that lately she has been feeling more short of breath, somewhat lightheaded, palpitations at times.  No chest pain.  Currently does not feel short of breath.  She on review of systems does endorse having some vaginal bleeding.  States that the vaginal bleeding has been going on for the past month.  She reports that she had a pill abortion back in May, seen on 5/31 for vaginal bleeding.  She states that she saw some sort of products past the following day.  Has been going through a couple pads daily.  Not soaking.  Not on blood thinners.  Denies any chronic medical problems.  Denies prior history of DVT/PE.  HPI     Home Medications Prior to Admission medications   Medication Sig Start Date End Date Taking? Authorizing Provider  doxycycline (VIBRAMYCIN) 100 MG capsule Take 1 capsule (100 mg total) by mouth 2 (two) times daily for 7 days. 01/12/22 01/19/22 Yes Othella Slappey, Quitman Livings, MD  metroNIDAZOLE (FLAGYL) 500 MG tablet Take 1 tablet (500 mg total) by mouth 2 (two) times daily. 01/12/22  Yes Milagros Loll, MD  prenatal vitamin w/FE, FA (PRENATAL 1 + 1) 27-1 MG TABS tablet Take 1 tablet by mouth daily at 12 noon. 08/30/20   Currie Paris, NP      Allergies    Patient has no known allergies.    Review of Systems   Review of Systems  Constitutional:  Negative for chills and fever.  HENT:  Negative for ear pain and sore throat.   Eyes:  Negative for pain and visual disturbance.  Respiratory:  Positive for shortness of breath.  Negative for cough.   Cardiovascular:  Positive for palpitations. Negative for chest pain.  Gastrointestinal:  Negative for abdominal pain and vomiting.  Genitourinary:  Positive for vaginal bleeding. Negative for dysuria and hematuria.  Musculoskeletal:  Negative for arthralgias and back pain.  Skin:  Negative for color change and rash.  Neurological:  Negative for seizures and syncope.  All other systems reviewed and are negative.   Physical Exam Updated Vital Signs BP 123/68   Pulse 68   Temp 98 F (36.7 C) (Oral)   Resp 13   Ht 5\' 10"  (1.778 m)   Wt 100.2 kg   LMP 09/21/2021   SpO2 100%   BMI 31.70 kg/m  Physical Exam Vitals and nursing note reviewed.  Constitutional:      General: She is not in acute distress.    Appearance: She is well-developed.  HENT:     Head: Normocephalic and atraumatic.  Eyes:     Conjunctiva/sclera: Conjunctivae normal.  Cardiovascular:     Rate and Rhythm: Normal rate and regular rhythm.     Heart sounds: No murmur heard. Pulmonary:     Effort: Pulmonary effort is normal. No respiratory distress.     Breath sounds: Normal breath sounds.  Abdominal:     Palpations: Abdomen is soft.     Tenderness: There is no abdominal tenderness.  Genitourinary:    Comments: RN chaperone, small vaginal blood noted, no pooling appreciated, os closed Musculoskeletal:        General: No swelling.     Cervical back: Neck supple.  Skin:    General: Skin is warm and dry.     Capillary Refill: Capillary refill takes less than 2 seconds.  Neurological:     Mental Status: She is alert.  Psychiatric:        Mood and Affect: Mood normal.     ED Results / Procedures / Treatments   Labs (all labs ordered are listed, but only abnormal results are displayed) Labs Reviewed  CBC - Abnormal; Notable for the following components:      Result Value   WBC 3.7 (*)    All other components within normal limits  I-STAT BETA HCG BLOOD, ED ([facility], [facility], [facility] ONLY) -  Abnormal; Notable for the following components:   I-stat hCG, quantitative 9.3 (*)    All other components within normal limits  WET PREP, GENITAL  BASIC METABOLIC PANEL  HCG, QUANTITATIVE, PREGNANCY  GC/CHLAMYDIA PROBE AMP ([place]) NOT AT [facility]  TROPONIN I (HIGH SENSITIVITY)  TROPONIN I (HIGH SENSITIVITY)    EKG EKG Interpretation  Date/Time:  Thursday January 12 2022 20:06:42 EDT Ventricular Rate:  64 PR Interval:  140 QRS Duration: 85 QT Interval:  405 QTC Calculation: 418 R Axis:   69 Text Interpretation: Sinus rhythm Confirmed by Marianna Fuss ([id number]) on 01/12/2022 9:44:13 PM  Radiology US OB Comp Less 14 Wks  Result Date: 01/12/2022 CLINICAL DATA:  Status post abortion 1 month ago, sent to evaluate for retained products of conception. EXAM: OBSTETRIC <14 WK ULTRASOUND TECHNIQUE: Transabdominal ultrasound was performed for evaluation of the gestation as well as the maternal uterus and adnexal regions. It should be noted that transvaginal imaging was refused by the patient. COMPARISON:  Dec 07, 2021 FINDINGS: Intrauterine gestational sac: Single Yolk sac:  Visualized. Embryo:  Not Visualized. Cardiac Activity: Not Visualized. Heart Rate: N/A bpm MSD:  4.5 mm   5 w   1 d Subchorionic hemorrhage:  Moderate Maternal uterus/adnexae: The endometrium measures 1.8 cm in thickness and is heterogeneous in appearance. Flow is noted within the endometrium on color Doppler evaluation. The right ovary measures 3.6 cm x 2.0 cm x 1.6 cm and is normal in appearance. The left ovary measures 3.1 cm x 2.3 cm x 2.2 cm and is normal in appearance. No pelvic free fluid is seen. IMPRESSION: 1. Findings resembling a single intrauterine gestational sac and yolk sac (at approximately 5 weeks and 1 day gestation by ultrasound evaluation) in a patient status post abortion 1 month ago. It should be noted that these findings are not seen prior study, dated Dec 07, 2021. 2. Thickened, hypervascular endometrium which  is consistent with retained products of conception. Electronically Signed   By: Aram Candela M.D.   On: 01/12/2022 23:08   DG Chest 2 View  Result Date: 01/12/2022 CLINICAL DATA:  Chest pain and shortness of breath for several days EXAM: CHEST - 2 VIEW COMPARISON:  X-ray from the previous day. FINDINGS: The heart size and mediastinal contours are within normal limits. Both lungs are clear. The visualized skeletal structures are unremarkable. Bilateral nipple shadows are noted. IMPRESSION: No active cardiopulmonary disease. Electronically Signed   By: Alcide Clever M.D.   On: 01/12/2022 19:26   DG Chest 2 View  Result Date: 01/11/2022 CLINICAL DATA:  Heart palpitations and shortness of breath.  EXAM: CHEST - 2 VIEW COMPARISON:  None Available. FINDINGS: The heart size and mediastinal contours are within normal limits. Both lungs are clear. The visualized skeletal structures are unremarkable. IMPRESSION: No active cardiopulmonary disease. Electronically Signed   By: Aram Candela M.D.   On: 01/11/2022 22:15    Procedures Procedures    Medications Ordered in ED Medications  sodium chloride 0.9 % bolus 1,000 mL (1,000 mLs Intravenous Bolus 01/12/22 2215)  cefTRIAXone (ROCEPHIN) injection 500 mg (500 mg Intramuscular Given 01/12/22 2341)  sterile water (preservative free) injection (10 mLs  Given 01/12/22 2341)    ED Course/ Medical Decision Making/ A&P                           Medical Decision Making Amount and/or Complexity of Data Reviewed Labs: ordered. Radiology: ordered.  Risk Prescription drug management.   26 year old presents to ER for multiple different complaints, palpitations, intermittent shortness of breath and lightheadedness.  On review of systems she also endorsed vaginal bleeding.  On physical exam she appears well in no distress, she has normal vital signs.  No tachypnea hypoxia or tachycardia, no ongoing shortness of breath and no chest pain, doubt PE. EKG without  ischemic change and troponin within normal limits, doubt ACS. Her beta is mildly positive.  Checked ultrasound. I independently reviewed and interpreted ultrasound images.  Also reviewed radiology report and discussed with the radiologist.  Findings concerning for single intrauterine gestational sac and yolk sac.  Findings not seen on prior ultrasound.  I discussed the case with Dr. Vergie Living on-call with OB/GYN.  He recommends close OB follow-up for outpatient D&C for retained products of conception.  He requests patient be checked for gonorrhea chlamydia and start empiric treatment with ceftriaxone, doxycycline, Flagyl.  I performed speculum exam, very small amount of blood in vaginal vault, no pooling appreciated, no active bleeding noted.  No discharge noted.  Her hemoglobin is normal.  Given my conversation with gynecology and patient's overall clinical picture, feel she is stable for discharge and outpatient management.  I discussed importance that she follow-up closely with gynecology and discussed strict return precautions should she have any worsening of her condition.    After the discussed management above, the patient was determined to be safe for discharge.  The patient was in agreement with this plan and all questions regarding their care were answered.  ED return precautions were discussed and the patient will return to the ED with any significant worsening of condition.         Final Clinical Impression(s) / ED Diagnoses Final diagnoses:  Vaginal bleeding  Retained products of conception following abortion    Rx / DC Orders ED Discharge Orders          Ordered    doxycycline (VIBRAMYCIN) 100 MG capsule  2 times daily        01/12/22 2345    metroNIDAZOLE (FLAGYL) 500 MG tablet  2 times daily        01/12/22 2345              Milagros Loll, MD 01/13/22 [phone]

## 2022-01-15 ENCOUNTER — Inpatient Hospital Stay (HOSPITAL_COMMUNITY): Payer: Medicaid Other | Attending: Obstetrics & Gynecology

## 2022-01-16 ENCOUNTER — Ambulatory Visit: Admit: 2022-01-16 | Payer: Medicaid Other | Admitting: Obstetrics and Gynecology

## 2022-01-16 SURGERY — DILATION AND EVACUATION, UTERUS
Anesthesia: Choice

## 2022-01-17 ENCOUNTER — Encounter: Payer: Self-pay | Admitting: Radiology

## 2022-01-23 ENCOUNTER — Telehealth: Payer: Self-pay

## 2022-01-23 ENCOUNTER — Other Ambulatory Visit: Payer: Self-pay

## 2022-01-23 ENCOUNTER — Ambulatory Visit (INDEPENDENT_AMBULATORY_CARE_PROVIDER_SITE_OTHER): Payer: Medicaid Other | Admitting: Obstetrics and Gynecology

## 2022-01-23 VITALS — BP 133/86 | HR 88 | Wt 202.4 lb

## 2022-01-23 DIAGNOSIS — O034 Incomplete spontaneous abortion without complication: Secondary | ICD-10-CM | POA: Diagnosis not present

## 2022-01-23 DIAGNOSIS — Z1331 Encounter for screening for depression: Secondary | ICD-10-CM

## 2022-01-23 DIAGNOSIS — Z5941 Food insecurity: Secondary | ICD-10-CM | POA: Diagnosis not present

## 2022-01-23 LAB — CBC
Hematocrit: 34.9 % (ref 34.0–46.6)
Hemoglobin: 11.7 g/dL (ref 11.1–15.9)
MCH: 31.3 pg (ref 26.6–33.0)
MCHC: 33.5 g/dL (ref 31.5–35.7)
MCV: 93 fL (ref 79–97)
Platelets: 231 10*3/uL (ref 150–450)
RBC: 3.74 x10E6/uL — ABNORMAL LOW (ref 3.77–5.28)
RDW: 14 % (ref 11.7–15.4)
WBC: 3.7 10*3/uL (ref 3.4–10.8)

## 2022-01-23 LAB — BETA HCG QUANT (REF LAB): hCG Quant: 1 m[IU]/mL

## 2022-01-23 NOTE — Progress Notes (Signed)
Obstetrics and Gynecology Visit Return Patient Evaluation  Appointment Date: 01/23/2022  Primary Care Provider: Patient, No Pcp Per  OBGYN Clinic: [facility]   Chief Complaint: follow up  History of Present Illness:  Dawn Moody is a 26 y.o. here for follow up  Patient had medication elective AB at 9wks in May 2023 and has had bleeding ever since; she states she wasn't able to make it to her abortion provider follow up visit.  She went to the ED on 7/6 and had a quant of 9 and u/s that showed retained POCs. Pt was set up for suction d&c for retained POCs but another provider reviewed her chart and recommended a repeat hcg, which the pt did not keep that appointment  Pt states light AUB continues. No fevers, chills, pain.   Review of Systems: as noted in the History of Present Illness.  Patient Active Problem List   Diagnosis Date Noted   Tobacco abuse 10/04/2020   Obesity    History of penicillin allergy 09/03/2020   History of syphilis 09/02/2020   Rubella non-immune status, antepartum 09/01/2020   Family history of breast cancer in mother 08/30/2020   Medications: None Allergies: has No Known Allergies.  Physical Exam:  BP 133/86   Pulse 88   Wt 202 lb 6.4 oz (91.8 kg)   LMP 09/21/2021   BMI 29.04 kg/m  Body mass index is 29.04 kg/m. General appearance: Well nourished, well developed female in no acute distress.  Abdomen: diffusely non tender to palpation, non distended, and no masses, hernias Neuro/Psych:  Normal mood and affect.    Radiology: See media tab for pictures. Please note I did the u/s and not "S. Kizzie Bane, RDMS" Scant old blood on transvag probe Normal sized mid plane uterus with material in the uterine cavity with no arterial or venous flow but there is a cyst/cystic structure (3-22mm in width) in the lower uterine segment. No obvious AV flow noted in the cyst area. Not definite for retained POCs.   No free fluid,  normal ovaries bilaterally.   Assessment: pt stable with continued AUB after medical elective AB in May  Plan:  1. Food insecurity - AMBULATORY REFERRAL TO [facility]  2. Positive depression screening - Beta hCG quant (ref lab) - Ambulatory referral to [facility]  3. Incomplete miscarriage I reviewed her ED u/s images and that was more definitive for retained POCs with +AV flow and a GS with something inside. I told her today that she may or may not have rPOCs but her uterine lining isn't normal and recommend d&c given her history which she is amenable to. Messaging sent to scheduling. O POS  - CBC   RTC: post op  Cornelia Copa MD Attending Center for [facility])

## 2022-01-23 NOTE — Telephone Encounter (Signed)
-----   Message from [place] Bing, MD sent at 01/23/2022 11:24 AM EDT ----- Regarding: needs suction d&c

## 2022-01-23 NOTE — Telephone Encounter (Signed)
Called and spoke to patient, surgery date, time, location and preop instructions given. Patient expressed understanding.

## 2022-01-24 ENCOUNTER — Other Ambulatory Visit: Payer: Self-pay

## 2022-01-24 ENCOUNTER — Encounter (HOSPITAL_COMMUNITY): Payer: Self-pay | Admitting: Obstetrics and Gynecology

## 2022-01-24 NOTE — Progress Notes (Addendum)
Dawn Moody denies chest pain or shortness of breath.  Patient denies having any s/s of Covid in her household.  Patient denies any known exposure to Covid.  Dawn Moody states that she has some of right hand laterally, right foot great toe and medially.  Patient also reports that she has tongue and mouth numbness, this started after 01/12/22- party that patient came to ED with palpations and shortness of breath.Dawn Moody stated that she does not routine party like she did on 01/12/22, patient admits to smoking "weed daily", and drinking 1/2 of a fifth a day, before 01/12/22 when she started on antibiotic and anti fungal.  Dawn Moody does not have a PCP.  I asked anesthesia PA-C or NP to review chart.

## 2022-01-24 NOTE — H&P (Signed)
Dawn Moody is an 26 y.o. female with suspected retained POC's. Suction D & C recommended to pt.  Blood type O positive   Menstrual History: Menarche age: 36 Patient's last menstrual period was 09/21/2021.    Past Medical History:  Diagnosis Date   Arthritis    Hypertension    Obesity     Past Surgical History:  Procedure Laterality Date   TYMPANOSTOMY TUBE PLACEMENT      Family History  Problem Relation Age of Onset   Hypertension Mother    Breast cancer Mother    Cancer Mother    Hypertension Father    Diabetes Father    Cancer Father     Social History:  reports that she has been smoking cigarettes. She has been smoking an average of 2 packs per day. She uses smokeless tobacco. She reports current alcohol use. She reports current drug use. Drugs: Cocaine and Marijuana.  Allergies: No Known Allergies  No medications prior to admission.    Review of Systems  Constitutional: Negative.   Respiratory: Negative.    Cardiovascular: Negative.   Gastrointestinal: Negative.   Genitourinary:  Positive for menstrual problem.    Last menstrual period 09/21/2021, unknown if currently breastfeeding. Physical Exam Cardiovascular:     Rate and Rhythm: Normal rate and regular rhythm.  Pulmonary:     Effort: Pulmonary effort is normal.     Breath sounds: Normal breath sounds.  Abdominal:     General: Bowel sounds are normal.     Palpations: Abdomen is soft.  Genitourinary:    Comments: Deferred to OR Neurological:     Mental Status: She is alert.     No results found for this or any previous visit (from the past 24 hour(s)).  No results found.  Assessment/Plan: Suspected retained POC's  Suction D & C recommended and reviewed with pt. R/B/Post op care reviewed with pt. Pt agrees to proceed.  Dawn Moody 01/24/2022, 1:27 PM

## 2022-01-24 NOTE — BH Specialist Note (Signed)
Pt did not arrive to video visit and did not answer the phone; Left HIPPA-compliant message to call back Garrett Bowring from [facility] at  [phone] (Ramiya Delahunty's office).  ?; left MyChart message for patient.  ? ?

## 2022-01-25 ENCOUNTER — Encounter (HOSPITAL_COMMUNITY)
Admission: RE | Disposition: A | Discharge status: Home / Self Care | Payer: Self-pay | Source: Home / Self Care | Attending: Obstetrics and Gynecology

## 2022-01-25 ENCOUNTER — Ambulatory Visit (HOSPITAL_COMMUNITY)
Admission: RE | Admit: 2022-01-25 | Discharge: 2022-01-25 | Disposition: A | Discharge status: Home / Self Care | Payer: Medicaid Other | Attending: Obstetrics and Gynecology | Admitting: Obstetrics and Gynecology

## 2022-01-25 ENCOUNTER — Encounter (HOSPITAL_COMMUNITY): Payer: Self-pay | Admitting: Obstetrics and Gynecology

## 2022-01-25 ENCOUNTER — Ambulatory Visit (HOSPITAL_BASED_OUTPATIENT_CLINIC_OR_DEPARTMENT_OTHER): Payer: Medicaid Other | Admitting: Emergency Medicine

## 2022-01-25 ENCOUNTER — Ambulatory Visit (HOSPITAL_COMMUNITY): Payer: Medicaid Other | Admitting: Emergency Medicine

## 2022-01-25 DIAGNOSIS — F17210 Nicotine dependence, cigarettes, uncomplicated: Secondary | ICD-10-CM | POA: Insufficient documentation

## 2022-01-25 DIAGNOSIS — O034 Incomplete spontaneous abortion without complication: Secondary | ICD-10-CM

## 2022-01-25 DIAGNOSIS — Z01818 Encounter for other preprocedural examination: Secondary | ICD-10-CM

## 2022-01-25 DIAGNOSIS — O029 Abnormal product of conception, unspecified: Secondary | ICD-10-CM

## 2022-01-25 DIAGNOSIS — O074 Failed attempted termination of pregnancy without complication: Secondary | ICD-10-CM | POA: Diagnosis not present

## 2022-01-25 HISTORY — DX: Family history of other specified conditions: Z84.89

## 2022-01-25 HISTORY — DX: Unspecified injury of head, initial encounter: S09.90XA

## 2022-01-25 HISTORY — PX: DILATION AND EVACUATION: SHX1459

## 2022-01-25 HISTORY — DX: Unspecified asthma, uncomplicated: J45.909

## 2022-01-25 LAB — CBC
HCT: 37.7 % (ref 36.0–46.0)
Hemoglobin: 12 g/dL (ref 12.0–15.0)
MCH: 30.8 pg (ref 26.0–34.0)
MCHC: 31.8 g/dL (ref 30.0–36.0)
MCV: 96.7 fL (ref 80.0–100.0)
Platelets: 213 10*3/uL (ref 150–400)
RBC: 3.9 MIL/uL (ref 3.87–5.11)
RDW: 12.9 % (ref 11.5–15.5)
WBC: 2.8 10*3/uL — ABNORMAL LOW (ref 4.0–10.5)
nRBC: 0 % (ref 0.0–0.2)

## 2022-01-25 LAB — BASIC METABOLIC PANEL
Anion gap: 8 (ref 5–15)
BUN: 9 mg/dL (ref 6–20)
CO2: 21 mmol/L — ABNORMAL LOW (ref 22–32)
Calcium: 9 mg/dL (ref 8.9–10.3)
Chloride: 107 mmol/L (ref 98–111)
Creatinine, Ser: 0.68 mg/dL (ref 0.44–1.00)
GFR, Estimated: >60 mL/min (ref >60)
Glucose, Bld: 82 mg/dL (ref 70–99)
Potassium: 3.8 mmol/L (ref 3.5–5.1)
Sodium: 136 mmol/L (ref 135–145)

## 2022-01-25 SURGERY — DILATION AND EVACUATION, UTERUS
Anesthesia: General | Site: Vagina

## 2022-01-25 MED ORDER — EPHEDRINE 5 MG/ML INJ
INTRAVENOUS | Status: AC
  Filled 2022-01-25: qty 5

## 2022-01-25 MED ORDER — LIDOCAINE 2% (20 MG/ML) 5 ML SYRINGE
INTRAMUSCULAR | Status: AC
Start: 2022-01-25 — End: ?
  Filled 2022-01-25: qty 5

## 2022-01-25 MED ORDER — MIDAZOLAM HCL 2 MG/2ML IJ SOLN
INTRAMUSCULAR | Status: AC
  Filled 2022-01-25: qty 2

## 2022-01-25 MED ORDER — DEXAMETHASONE SODIUM PHOSPHATE 10 MG/ML IJ SOLN
INTRAMUSCULAR | Status: AC
  Filled 2022-01-25: qty 1

## 2022-01-25 MED ORDER — PROPOFOL 10 MG/ML IV BOLUS
INTRAVENOUS | Status: DC | PRN
  Administered 2022-01-25: 200 mg via INTRAVENOUS

## 2022-01-25 MED ORDER — PROPOFOL 10 MG/ML IV BOLUS
INTRAVENOUS | Status: AC
Start: 2022-01-25 — End: ?
  Filled 2022-01-25: qty 20

## 2022-01-25 MED ORDER — SOD CITRATE-CITRIC ACID 500-334 MG/5ML PO SOLN
30.0000 mL | ORAL | Status: AC
  Administered 2022-01-25: 30 mL via ORAL
  Filled 2022-01-25: qty 30

## 2022-01-25 MED ORDER — CHLORHEXIDINE GLUCONATE 0.12 % MT SOLN
15.0000 mL | Freq: Once | OROMUCOSAL | Status: AC
  Administered 2022-01-25: 15 mL via OROMUCOSAL
  Filled 2022-01-25: qty 15

## 2022-01-25 MED ORDER — ONDANSETRON HCL 4 MG/2ML IJ SOLN: 4.0000 mg | Freq: Once | INTRAMUSCULAR | Status: DC | PRN

## 2022-01-25 MED ORDER — POVIDONE-IODINE 10 % EX SWAB
2.0000 | Freq: Once | CUTANEOUS | Status: AC
  Administered 2022-01-25: 2 via TOPICAL

## 2022-01-25 MED ORDER — DEXAMETHASONE SODIUM PHOSPHATE 10 MG/ML IJ SOLN
INTRAMUSCULAR | Status: DC | PRN
  Administered 2022-01-25: 10 mg via INTRAVENOUS

## 2022-01-25 MED ORDER — OXYCODONE HCL 5 MG PO TABS: 5.0000 mg | ORAL_TABLET | Freq: Four times a day (QID) | ORAL | 0 refills | Status: DC | PRN

## 2022-01-25 MED ORDER — DEXMEDETOMIDINE (PRECEDEX) IN NS 20 MCG/5ML (4 MCG/ML) IV SYRINGE
PREFILLED_SYRINGE | INTRAVENOUS | Status: DC | PRN
  Administered 2022-01-25: 20 ug via INTRAVENOUS

## 2022-01-25 MED ORDER — 0.9 % SODIUM CHLORIDE (POUR BTL) OPTIME
TOPICAL | Status: DC | PRN
  Administered 2022-01-25: 1000 mL

## 2022-01-25 MED ORDER — DEXTROSE-NACL 5-0.45 % IV SOLN: INTRAVENOUS | Status: DC

## 2022-01-25 MED ORDER — ORAL CARE MOUTH RINSE: 15.0000 mL | Freq: Once | OROMUCOSAL | Status: AC

## 2022-01-25 MED ORDER — MISOPROSTOL 200 MCG PO TABS
ORAL_TABLET | ORAL | Status: AC
  Filled 2022-01-25: qty 1

## 2022-01-25 MED ORDER — KETOROLAC TROMETHAMINE 30 MG/ML IJ SOLN
INTRAMUSCULAR | Status: AC
  Filled 2022-01-25: qty 1

## 2022-01-25 MED ORDER — KETOROLAC TROMETHAMINE 30 MG/ML IJ SOLN: 30.0000 mg | Freq: Once | INTRAMUSCULAR | Status: DC | PRN

## 2022-01-25 MED ORDER — MISOPROSTOL 200 MCG PO TABS
ORAL_TABLET | ORAL | Status: DC | PRN
  Administered 2022-01-25: 200 ug via RECTAL

## 2022-01-25 MED ORDER — BUPIVACAINE HCL (PF) 0.25 % IJ SOLN
INTRAMUSCULAR | Status: AC
  Filled 2022-01-25: qty 10

## 2022-01-25 MED ORDER — ACETAMINOPHEN 500 MG PO TABS: 1000.0000 mg | ORAL_TABLET | ORAL | Status: DC

## 2022-01-25 MED ORDER — ONDANSETRON HCL 4 MG/2ML IJ SOLN
INTRAMUSCULAR | Status: AC
  Filled 2022-01-25: qty 2

## 2022-01-25 MED ORDER — DOXYCYCLINE HYCLATE 100 MG IV SOLR
200.0000 mg | INTRAVENOUS | Status: AC
  Administered 2022-01-25: 200 mg via INTRAVENOUS
  Filled 2022-01-25: qty 100

## 2022-01-25 MED ORDER — FENTANYL CITRATE (PF) 250 MCG/5ML IJ SOLN
INTRAMUSCULAR | Status: AC
  Filled 2022-01-25: qty 5

## 2022-01-25 MED ORDER — EPHEDRINE SULFATE-NACL 50-0.9 MG/10ML-% IV SOSY
PREFILLED_SYRINGE | INTRAVENOUS | Status: DC | PRN
  Administered 2022-01-25 (×2): 5 mg via INTRAVENOUS

## 2022-01-25 MED ORDER — KETOROLAC TROMETHAMINE 15 MG/ML IJ SOLN
15.0000 mg | INTRAMUSCULAR | Status: AC
  Administered 2022-01-25: 30 mg via INTRAVENOUS
  Filled 2022-01-25: qty 1

## 2022-01-25 MED ORDER — OXYCODONE HCL 5 MG PO TABS: 5.0000 mg | ORAL_TABLET | Freq: Once | ORAL | Status: DC | PRN

## 2022-01-25 MED ORDER — MIDAZOLAM HCL 2 MG/2ML IJ SOLN
INTRAMUSCULAR | Status: DC | PRN
  Administered 2022-01-25 (×2): 2 mg via INTRAVENOUS

## 2022-01-25 MED ORDER — LACTATED RINGERS IV SOLN: INTRAVENOUS | Status: DC

## 2022-01-25 MED ORDER — FENTANYL CITRATE (PF) 100 MCG/2ML IJ SOLN: 25.0000 ug | INTRAMUSCULAR | Status: DC | PRN

## 2022-01-25 MED ORDER — ONDANSETRON HCL 4 MG/2ML IJ SOLN
INTRAMUSCULAR | Status: DC | PRN
  Administered 2022-01-25: 4 mg via INTRAVENOUS

## 2022-01-25 MED ORDER — LIDOCAINE 2% (20 MG/ML) 5 ML SYRINGE
INTRAMUSCULAR | Status: DC | PRN
  Administered 2022-01-25: 100 mg via INTRAVENOUS

## 2022-01-25 MED ORDER — OXYCODONE HCL 5 MG/5ML PO SOLN: 5.0000 mg | Freq: Once | ORAL | Status: DC | PRN

## 2022-01-25 MED ORDER — STERILE WATER FOR IRRIGATION IR SOLN
Status: DC | PRN
  Administered 2022-01-25: 1000 mL

## 2022-01-25 MED ORDER — IBUPROFEN 800 MG PO TABS: 800.0000 mg | ORAL_TABLET | Freq: Three times a day (TID) | ORAL | 0 refills | Status: DC | PRN

## 2022-01-25 SURGICAL SUPPLY — 21 items
CATH ROBINSON RED A/P 16FR (CATHETERS) ×2 IMPLANT
FILTER UTR ASPR ASSEMBLY (MISCELLANEOUS) ×2 IMPLANT
GLOVE BIO SURGEON STRL SZ7.5 (GLOVE) ×2 IMPLANT
GLOVE BIOGEL PI IND STRL 7.0 (GLOVE) ×1 IMPLANT
GLOVE BIOGEL PI IND STRL 7.5 (GLOVE) IMPLANT
GLOVE BIOGEL PI INDICATOR 7.0 (GLOVE) ×1
GLOVE BIOGEL PI INDICATOR 7.5 (GLOVE) ×1
GLOVE ECLIPSE 6.5 STRL STRAW (GLOVE) ×1 IMPLANT
GOWN STRL REUS W/ TWL LRG LVL3 (GOWN DISPOSABLE) ×1 IMPLANT
GOWN STRL REUS W/ TWL XL LVL3 (GOWN DISPOSABLE) ×1 IMPLANT
GOWN STRL REUS W/TWL LRG LVL3 (GOWN DISPOSABLE) ×1
GOWN STRL REUS W/TWL XL LVL3 (GOWN DISPOSABLE) ×1
HOSE CONNECTING 18IN BERKELEY (TUBING) ×2 IMPLANT
KIT PROCEDURE FLUENT (KITS) ×1 IMPLANT
NS IRRIG 1000ML POUR BTL (IV SOLUTION) ×2 IMPLANT
PACK VAGINAL MINOR WOMEN LF (CUSTOM PROCEDURE TRAY) ×2 IMPLANT
PAD OB MATERNITY 4.3X12.25 (PERSONAL CARE ITEMS) ×2 IMPLANT
SET BERKELEY SUCTION TUBING (SUCTIONS) ×2 IMPLANT
TOWEL GREEN STERILE FF (TOWEL DISPOSABLE) ×4 IMPLANT
UNDERPAD 30X36 HEAVY ABSORB (UNDERPADS AND DIAPERS) ×2 IMPLANT
VACURETTE 8MM F TIP (MISCELLANEOUS) ×1 IMPLANT

## 2022-01-25 NOTE — Op Note (Signed)
Dawn Moody PROCEDURE DATE: 01/25/2022  PREOPERATIVE DIAGNOSIS: Retained POC's POSTOPERATIVE DIAGNOSIS: The same PROCEDURE:     Dilation and Evacuation SURGEON:  Dr. Nettie Elm  INDICATIONS: 26 y.o. G3P1010 with suspected retained POC's needing surgical completion.  Risks of surgery were discussed with the patient including but not limited to: bleeding which may require transfusion; infection which may require antibiotics; injury to uterus or surrounding organs; need for additional procedures including laparotomy or laparoscopy; possibility of intrauterine scarring which may impair future fertility; and other postoperative/anesthesia complications. Written informed consent was obtained.    FINDINGS:  A 8 week size uterus, moderate amounts of products of conception, specimen sent to pathology.  ANESTHESIA:    Monitored intravenous sedation INTRAVENOUS FLUIDS:As recorded ESTIMATED BLOOD LOSS:  Less than 20 ml. SPECIMENS:  Products of conception sent to pathology COMPLICATIONS:  None immediate.  PROCEDURE DETAILS:  The patient received intravenous Doxycycline while in the preoperative area.  She was then taken to the operating room where monitored intravenous sedation was administered and was found to be adequate.  After an adequate timeout was performed, she was placed in the dorsal lithotomy position and examined; then prepped and draped in the sterile manner.   Her bladder was catheterized for an unmeasured amount of clear, yellow urine. A vaginal speculum was then placed in the patient's vagina and a single tooth tenaculum was applied to the anterior lip of the cervix.  The cervix was gently dilated to accommodate a 8 mm suction curette that was gently advanced to the uterine fundus.  The suction device was then activated and curette slowly rotated to clear the uterus of products of conception.  A sharp curettage was then performed to confirm complete emptying of the uterus. There was minimal  bleeding noted and the tenaculum removed with good hemostasis noted.   All instruments were removed from the patient's vagina.  Sponge and instrument counts were correct times two. 200 mcg of Cytotec was placed rectal at conclusion of the case. The patient tolerated the procedure well and was taken to the recovery area awake, and in stable condition.  The patient will be discharged to home as per PACU criteria.  Routine postoperative instructions given.  She was prescribed Percocet, Ibuprofen and Colace.  She will follow up in the clinic on 2-3 weeks for postoperative evaluation.    Nettie Elm, MD, FACOG Attending Obstetrician & Gynecologist Faculty Practice, [facility]

## 2022-01-25 NOTE — Transfer of Care (Signed)
Immediate Anesthesia Transfer of Care Note  Patient: Dawn Moody  Procedure(s) Performed: DILATATION AND EVACUATION (Vagina )  Patient Location: PACU  Anesthesia Type:General  Level of Consciousness: drowsy  Airway & Oxygen Therapy: Patient Spontanous Breathing and Patient connected to face mask oxygen  Post-op Assessment: Report given to RN and Post -op Vital signs reviewed and stable  Post vital signs: Reviewed and stable  Last Vitals:  Vitals Value Taken Time  BP 119/78 01/25/22 1315  Temp    Pulse 69 01/25/22 1320  Resp 20 01/25/22 1320  SpO2 100 % 01/25/22 1320  Vitals shown include unvalidated device data.  Last Pain:  Vitals:   01/25/22 1021  TempSrc: Oral         Complications: No notable events documented.

## 2022-01-25 NOTE — Anesthesia Preprocedure Evaluation (Signed)
Anesthesia Evaluation  Patient identified by MRN, date of birth, ID band Patient awake    Reviewed: Allergy & Precautions, NPO status , Patient's Chart, lab work & pertinent test results  Airway Mallampati: II  TM Distance: >3 FB Neck ROM: Full    Dental no notable dental hx.    Pulmonary asthma , Current Smoker and Patient abstained from smoking.,    Pulmonary exam normal breath sounds clear to auscultation       Cardiovascular hypertension, Normal cardiovascular exam Rhythm:Regular Rate:Normal     Neuro/Psych negative neurological ROS  negative psych ROS   GI/Hepatic negative GI ROS, (+)     substance abuse  cocaine use,   Endo/Other  negative endocrine ROS  Renal/GU negative Renal ROS  negative genitourinary   Musculoskeletal negative musculoskeletal ROS (+)   Abdominal   Peds negative pediatric ROS (+)  Hematology negative hematology ROS (+)   Anesthesia Other Findings   Reproductive/Obstetrics negative OB ROS                             Anesthesia Physical Anesthesia Plan  ASA: 2  Anesthesia Plan: General   Post-op Pain Management: Minimal or no pain anticipated   Induction: Intravenous  PONV Risk Score and Plan: 2 and Ondansetron, Dexamethasone and Treatment may vary due to age or medical condition  Airway Management Planned: LMA  Additional Equipment:   Intra-op Plan:   Post-operative Plan: Extubation in OR  Informed Consent: I have reviewed the patients History and Physical, chart, labs and discussed the procedure including the risks, benefits and alternatives for the proposed anesthesia with the patient or authorized representative who has indicated his/her understanding and acceptance.     Dental advisory given  Plan Discussed with: CRNA and Surgeon  Anesthesia Plan Comments:         Anesthesia Quick Evaluation

## 2022-01-25 NOTE — Interval H&P Note (Signed)
History and Physical Interval Note:  01/25/2022 11:44 AM  Dawn Moody  has presented today for surgery, with the diagnosis of Retained products of conception.  The various methods of treatment have been discussed with the patient and family. After consideration of risks, benefits and other options for treatment, the patient has consented to  Procedure(s): DILATATION AND EVACUATION (N/A) as a surgical intervention.  The patient's history has been reviewed, patient examined, no change in status, stable for surgery.  I have reviewed the patient's chart and labs.  Questions were answered to the patient's satisfaction.     [name]

## 2022-01-25 NOTE — Anesthesia Procedure Notes (Signed)
Procedure Name: LMA Insertion Date/Time: 01/25/2022 12:23 PM  Performed by: Tressia Miners, CRNAPre-anesthesia Checklist: Patient identified, Emergency Drugs available, Suction available and Patient being monitored Patient Re-evaluated:Patient Re-evaluated prior to induction Oxygen Delivery Method: Circle System Utilized Preoxygenation: Pre-oxygenation with 100% oxygen Induction Type: IV induction Ventilation: Mask ventilation without difficulty LMA: LMA inserted LMA Size: 4.0 Number of attempts: 1 Airway Equipment and Method: Bite block Placement Confirmation: positive ETCO2 Tube secured with: Tape Dental Injury: Teeth and Oropharynx as per pre-operative assessment

## 2022-01-25 NOTE — Anesthesia Postprocedure Evaluation (Signed)
Anesthesia Post Note  Patient: Elfrida Pixley  Procedure(s) Performed: DILATATION AND EVACUATION (Vagina )     Patient location during evaluation: PACU Anesthesia Type: General Level of consciousness: awake and alert Pain management: pain level controlled Vital Signs Assessment: post-procedure vital signs reviewed and stable Respiratory status: spontaneous breathing, nonlabored ventilation, respiratory function stable and patient connected to nasal cannula oxygen Cardiovascular status: blood pressure returned to baseline and stable Postop Assessment: no apparent nausea or vomiting Anesthetic complications: no   No notable events documented.  Last Vitals:  Vitals:   01/25/22 1345 01/25/22 1400  BP: 113/89 120/81  Pulse: 64 (!) 52  Resp: 18 18  Temp:    SpO2: 100% 100%    Last Pain:  Vitals:   01/25/22 1345  TempSrc:   PainSc: 0-No pain                 Shantee Hayne S

## 2022-01-26 ENCOUNTER — Other Ambulatory Visit: Payer: Self-pay

## 2022-01-26 ENCOUNTER — Encounter (HOSPITAL_COMMUNITY): Payer: Self-pay | Admitting: Obstetrics and Gynecology

## 2022-01-26 LAB — SURGICAL PATHOLOGY

## 2022-02-01 ENCOUNTER — Ambulatory Visit: Payer: Medicaid Other | Admitting: Clinical

## 2022-02-01 DIAGNOSIS — Z91199 Patient's noncompliance with other medical treatment and regimen due to unspecified reason: Secondary | ICD-10-CM

## 2022-02-16 ENCOUNTER — Encounter: Payer: Self-pay | Admitting: Obstetrics and Gynecology

## 2022-02-16 ENCOUNTER — Ambulatory Visit (INDEPENDENT_AMBULATORY_CARE_PROVIDER_SITE_OTHER): Payer: Medicaid Other | Admitting: Obstetrics and Gynecology

## 2022-02-16 VITALS — BP 120/81 | HR 84 | Ht 69.0 in | Wt 201.3 lb

## 2022-02-16 DIAGNOSIS — Z9889 Other specified postprocedural states: Secondary | ICD-10-CM

## 2022-02-16 DIAGNOSIS — Z309 Encounter for contraceptive management, unspecified: Secondary | ICD-10-CM | POA: Insufficient documentation

## 2022-02-16 DIAGNOSIS — Z30016 Encounter for initial prescription of transdermal patch hormonal contraceptive device: Secondary | ICD-10-CM

## 2022-02-16 DIAGNOSIS — Z1331 Encounter for screening for depression: Secondary | ICD-10-CM

## 2022-02-16 DIAGNOSIS — Z5941 Food insecurity: Secondary | ICD-10-CM

## 2022-02-16 HISTORY — DX: Food insecurity: Z59.41

## 2022-02-16 HISTORY — DX: Encounter for contraceptive management, unspecified: Z30.9

## 2022-02-16 HISTORY — DX: Other specified postprocedural states: Z98.890

## 2022-02-16 MED ORDER — NORELGESTROMIN-ETH ESTRADIOL 150-35 MCG/24HR TD PTWK
1.0000 | MEDICATED_PATCH | TRANSDERMAL | 12 refills | Status: DC
Start: 2022-02-16 — End: 2022-12-19

## 2022-02-16 NOTE — Progress Notes (Signed)
Dawn Moody presents for post op from D & C for retained POC. Pathology reviewed with pt Started cycle today Denies any bowel or bladder dysfunction.  Desires to restart contraception patches.  Pap smear 2/22, normal  PE AF VSS Lungs clear Herat RRR Abd soft + BS  A/P Post op        Contraception management        Food insecurity  To the food pantry today. Start Xulane this Sunday. U/R/Back method reviewed with pt. F/U in 1 yr or PRN

## 2022-02-16 NOTE — Progress Notes (Signed)
Pt failed PHQ-9 SCORED : 20 GAD7: 15 Referral sent to Behavioral Health/ Asher Muir

## 2022-02-16 NOTE — Patient Instructions (Signed)

## 2022-02-16 NOTE — Addendum Note (Signed)
Addended by: Henrietta Dine on: 02/16/2022 02:50 PM   Modules accepted: Orders

## 2022-02-21 NOTE — BH Specialist Note (Signed)
Pt did not arrive to video visit and did not answer the phone; Left HIPPA-compliant message to call back Mireyah Chervenak from [facility] at  [phone] (Kiyani Jernigan's office).  ?; left MyChart message for patient.  ? ?

## 2022-03-06 ENCOUNTER — Ambulatory Visit: Payer: Medicaid Other | Admitting: Clinical

## 2022-03-06 DIAGNOSIS — Z91199 Patient's noncompliance with other medical treatment and regimen due to unspecified reason: Secondary | ICD-10-CM

## 2022-03-09 DIAGNOSIS — F1010 Alcohol abuse, uncomplicated: Secondary | ICD-10-CM | POA: Diagnosis not present

## 2022-03-09 DIAGNOSIS — Z3202 Encounter for pregnancy test, result negative: Secondary | ICD-10-CM | POA: Diagnosis not present

## 2022-09-14 ENCOUNTER — Ambulatory Visit (HOSPITAL_COMMUNITY)
Admission: EM | Admit: 2022-09-14 | Discharge: 2022-09-14 | Disposition: A | Discharge status: Home / Self Care | Payer: Medicaid Other | Attending: Internal Medicine | Admitting: Internal Medicine

## 2022-09-14 ENCOUNTER — Encounter (HOSPITAL_COMMUNITY): Payer: Self-pay | Admitting: Emergency Medicine

## 2022-09-14 DIAGNOSIS — N760 Acute vaginitis: Secondary | ICD-10-CM | POA: Insufficient documentation

## 2022-09-14 MED ORDER — METRONIDAZOLE 0.75 % VA GEL: 1.0000 | Freq: Every day | VAGINAL | 0 refills | Status: AC

## 2022-09-14 NOTE — ED Triage Notes (Signed)
Having vaginal discharge and smells for 2-3 weeks. Tried drinking more water and Monastat.  Pt reports is aprox [redacted] weeks pregnant. Also has new sexual partner and would [facility] STD testing.

## 2022-09-14 NOTE — Discharge Instructions (Signed)
Please abstain from sexual intercourse while as we wait for the lab results Safe sex practices advised. Will call you with recommendations if labs are abnormal If you have worsening symptoms please return to urgent care to be reevaluated.

## 2022-09-15 LAB — CERVICOVAGINAL ANCILLARY ONLY
Bacterial Vaginitis (gardnerella): POSITIVE — AB
Candida Glabrata: NEGATIVE
Candida Vaginitis: NEGATIVE
Chlamydia: NEGATIVE
Comment: NEGATIVE
Comment: NEGATIVE
Comment: NEGATIVE
Comment: NEGATIVE
Comment: NEGATIVE
Comment: NORMAL
Neisseria Gonorrhea: NEGATIVE
Trichomonas: NEGATIVE

## 2022-09-15 NOTE — ED Provider Notes (Signed)
[facility]    CSN: [id number] Arrival date & time: 09/14/22  1814      History   Chief Complaint Chief Complaint  Patient presents with   Vaginal Discharge    HPI Dawn Moody is a 27 y.o. female comes to the urgent care with a 2 to 3-week history of malodorous vaginal discharge.  Discharge is clear and occasional white.  The consistency of the vaginal discharge is inconsistent.  Patient sometimes describes thick vaginal discharge and other times the discharge chest pain.  She denies any lower abdominal pain.  No rash in the vaginal area.  No pain on urination.  No urgency or frequency.  Patient is sexually active.  She denies any deep dyspareunia.  She is currently [redacted] weeks pregnant.  She started having sexual intercourse with a new sexual partner and she is requesting STD screening.Marland Kitchen   HPI  Past Medical History:  Diagnosis Date   Arthritis    both hips and hands   Asthma    Family history of adverse reaction to anesthesia    mother hard to put to sleep, woke up during surgery   Head injury    Hypertension    Obesity     Patient Active Problem List   Diagnosis Date Noted   Post-operative state 02/16/2022   Food insecurity 02/16/2022   Contraceptive management 02/16/2022   Tobacco abuse 10/04/2020   Obesity    History of penicillin allergy 09/03/2020   History of syphilis 09/02/2020   Family history of breast cancer in mother 08/30/2020    Past Surgical History:  Procedure Laterality Date   DILATION AND EVACUATION N/A 01/25/2022   Procedure: DILATATION AND EVACUATION;  Surgeon: Chancy Milroy, MD;  Location: [facility];  Service: Gynecology;  Laterality: N/A;   TYMPANOSTOMY TUBE PLACEMENT     as a child    OB History     Gravida  3   Para  1   Term  1   Preterm  0   AB  1   Living  0      SAB  1   IAB  0   Ectopic  0   Multiple  0   Live Births  0            Home Medications    Prior to Admission medications   Medication  Sig Start Date End Date Taking? Authorizing Provider  metroNIDAZOLE (METROGEL) 0.75 % vaginal gel Place 1 Applicatorful vaginally at bedtime for 7 days. 09/14/22 09/21/22 Yes Tarena Gockley, Myrene Galas, MD  norelgestromin-ethinyl estradiol Marilu Favre) 150-35 MCG/24HR transdermal patch Place 1 patch onto the skin once a week. 02/16/22   Chancy Milroy, MD    Family History Family History  Problem Relation Age of Onset   Hypertension Mother    Breast cancer Mother    Cancer Mother    Hypertension Father    Diabetes Father    Cancer Father     Social History Social History   Tobacco Use   Smoking status: Every Day    Packs/day: 0.30    Years: 10.00    Total pack years: 3.00    Types: Cigarettes   Smokeless tobacco: Current  Vaping Use   Vaping Use: Some days   Substances: Nicotine   Devices: occassional  Substance Use Topics   Alcohol use: Not Currently    Comment: .18/23 on  antibiotics, before antibiotics 1 fifth every 2 days   Drug use: Yes  Frequency: 7.0 times per week    Types: Cocaine, Marijuana    Comment: marijuana. no cocaine since 01/12/22     Allergies   Tylenol [acetaminophen]   Review of Systems Review of Systems As per HPI  Physical Exam Triage Vital Signs ED Triage Vitals  Enc Vitals Group     BP 09/14/22 1906 (!) 143/78     Pulse Rate 09/14/22 1906 89     Resp 09/14/22 1906 15     Temp 09/14/22 1906 98.3 F (36.8 C)     Temp Source 09/14/22 1906 Oral     SpO2 09/14/22 1906 98 %     Weight --      Height --      Head Circumference --      Peak Flow --      Pain Score 09/14/22 1903 8     Pain Loc --      Pain Edu? --      Excl. in [place]? --    No data found.  Updated Vital Signs BP (!) 143/78   Pulse 89   Temp 98.3 F (36.8 C) (Oral)   Resp 15   LMP 04/24/2022   SpO2 98%   Visual Acuity Right Eye Distance:   Left Eye Distance:   Bilateral Distance:    Right Eye Near:   Left Eye Near:    Bilateral Near:     Physical  Exam Constitutional:      Appearance: Normal appearance.  Cardiovascular:     Rate and Rhythm: Normal rate and regular rhythm.  Pulmonary:     Effort: Pulmonary effort is normal.     Breath sounds: Normal breath sounds.  Abdominal:     General: Bowel sounds are normal.     Palpations: Abdomen is soft.  Neurological:     Mental Status: She is alert.      UC Treatments / Results  Labs (all labs ordered are listed, but only abnormal results are displayed) Labs Reviewed  CERVICOVAGINAL ANCILLARY ONLY    EKG   Radiology No results found.  Procedures Procedures (including critical care time)  Medications Ordered in UC Medications - No data to display  Initial Impression / Assessment and Plan / UC Course  I have reviewed the triage vital signs and the nursing notes.  Pertinent labs & imaging results that were available during my care of the patient were reviewed by me and considered in my medical decision making (see chart for details).     1.  Acute vaginitis: Cervicovaginal swab for GC/chlamydia/trichomonas/bacterial vaginosis/vaginal yeast Intravaginal Flagyl nightly for 7 days Safe sex practices advised Patient advised to avoid sexual intercourse until lab results are available Will reach out to patient with recommendations if labs are abnormal. Return precautions given Final Clinical Impressions(s) / UC Diagnoses   Final diagnoses:  Acute vaginitis     Discharge Instructions      Please abstain from sexual intercourse while as we wait for the lab results Safe sex practices advised. Will call you with recommendations if labs are abnormal If you have worsening symptoms please return to urgent care to be reevaluated.   ED Prescriptions     Medication Sig Dispense Auth. Provider   metroNIDAZOLE (METROGEL) 0.75 % vaginal gel Place 1 Applicatorful vaginally at bedtime for 7 days. 70 g Jamilex Bohnsack, Myrene Galas, MD      PDMP not reviewed this encounter.    Chase Picket, MD 09/15/22 1359

## 2022-09-25 ENCOUNTER — Ambulatory Visit (INDEPENDENT_AMBULATORY_CARE_PROVIDER_SITE_OTHER): Payer: Medicaid Other | Admitting: *Deleted

## 2022-09-25 DIAGNOSIS — Z3200 Encounter for pregnancy test, result unknown: Secondary | ICD-10-CM

## 2022-09-25 DIAGNOSIS — Z3201 Encounter for pregnancy test, result positive: Secondary | ICD-10-CM | POA: Diagnosis not present

## 2022-09-25 LAB — POCT PREGNANCY, URINE: Preg Test, Ur: POSITIVE — AB

## 2022-09-25 MED ORDER — PRENATAL VITAMIN 27-0.8 MG PO TABS: 1.0000 | ORAL_TABLET | Freq: Every day | ORAL | 11 refills | Status: DC

## 2022-09-25 NOTE — Progress Notes (Signed)
Patient left a urine for pregnancy test which was positive. I called and informed her. She reports sure LMP  of 04/23/22. I informed her this makes her [redacted]w[redacted]d pregnant with EDD 01/28/23. I recommended she start prenatal care asap. She plans to go here. Call transferred to front desk to transfer at end of visit. I also reviewed her meds with her and advised she start PNV. She requested RX for PNV. RX sent.  Staci Acosta

## 2022-09-28 ENCOUNTER — Encounter (HOSPITAL_COMMUNITY): Payer: Self-pay

## 2022-09-28 ENCOUNTER — Other Ambulatory Visit: Payer: Self-pay

## 2022-09-28 ENCOUNTER — Emergency Department (HOSPITAL_COMMUNITY)
Admission: EM | Admit: 2022-09-28 | Discharge: 2022-09-28 | Disposition: A | Discharge status: Home / Self Care | Payer: Medicaid Other | Attending: Emergency Medicine | Admitting: Emergency Medicine

## 2022-09-28 ENCOUNTER — Emergency Department (HOSPITAL_COMMUNITY): Payer: Medicaid Other

## 2022-09-28 DIAGNOSIS — R1084 Generalized abdominal pain: Secondary | ICD-10-CM | POA: Insufficient documentation

## 2022-09-28 DIAGNOSIS — O26892 Other specified pregnancy related conditions, second trimester: Secondary | ICD-10-CM | POA: Diagnosis not present

## 2022-09-28 DIAGNOSIS — R197 Diarrhea, unspecified: Secondary | ICD-10-CM | POA: Diagnosis not present

## 2022-09-28 DIAGNOSIS — Z3A23 23 weeks gestation of pregnancy: Secondary | ICD-10-CM | POA: Diagnosis not present

## 2022-09-28 DIAGNOSIS — O219 Vomiting of pregnancy, unspecified: Secondary | ICD-10-CM | POA: Diagnosis not present

## 2022-09-28 DIAGNOSIS — R112 Nausea with vomiting, unspecified: Secondary | ICD-10-CM

## 2022-09-28 LAB — COMPREHENSIVE METABOLIC PANEL
ALT: 11 U/L (ref 0–44)
AST: 20 U/L (ref 15–41)
Albumin: 3.3 g/dL — ABNORMAL LOW (ref 3.5–5.0)
Alkaline Phosphatase: 52 U/L (ref 38–126)
Anion gap: 9 (ref 5–15)
BUN: 8 mg/dL (ref 6–20)
CO2: 19 mmol/L — ABNORMAL LOW (ref 22–32)
Calcium: 8.4 mg/dL — ABNORMAL LOW (ref 8.9–10.3)
Chloride: 106 mmol/L (ref 98–111)
Creatinine, Ser: 0.53 mg/dL (ref 0.44–1.00)
GFR, Estimated: >60 mL/min (ref >60)
Glucose, Bld: 90 mg/dL (ref 70–99)
Potassium: 3.4 mmol/L — ABNORMAL LOW (ref 3.5–5.1)
Sodium: 134 mmol/L — ABNORMAL LOW (ref 135–145)
Total Bilirubin: 0.8 mg/dL (ref 0.3–1.2)
Total Protein: 6.8 g/dL (ref 6.5–8.1)

## 2022-09-28 LAB — CBC WITH DIFFERENTIAL/PLATELET
Abs Immature Granulocytes: 0.05 10*3/uL (ref 0.00–0.07)
Basophils Absolute: 0 10*3/uL (ref 0.0–0.1)
Basophils Relative: 0 %
Eosinophils Absolute: 0 10*3/uL (ref 0.0–0.5)
Eosinophils Relative: 0 %
HCT: 34.3 % — ABNORMAL LOW (ref 36.0–46.0)
Hemoglobin: 11.4 g/dL — ABNORMAL LOW (ref 12.0–15.0)
Immature Granulocytes: 1 %
Lymphocytes Relative: 10 %
Lymphs Abs: 0.8 10*3/uL (ref 0.7–4.0)
MCH: 31.8 pg (ref 26.0–34.0)
MCHC: 33.2 g/dL (ref 30.0–36.0)
MCV: 95.8 fL (ref 80.0–100.0)
Monocytes Absolute: 0.3 10*3/uL (ref 0.1–1.0)
Monocytes Relative: 3 %
Neutro Abs: 6.9 10*3/uL (ref 1.7–7.7)
Neutrophils Relative %: 86 %
Platelets: 182 10*3/uL (ref 150–400)
RBC: 3.58 MIL/uL — ABNORMAL LOW (ref 3.87–5.11)
RDW: 13.8 % (ref 11.5–15.5)
WBC: 8 10*3/uL (ref 4.0–10.5)
nRBC: 0 % (ref 0.0–0.2)

## 2022-09-28 LAB — LIPASE, BLOOD: Lipase: 25 U/L (ref 11–51)

## 2022-09-28 LAB — I-STAT BETA HCG BLOOD, ED (MC, WL, AP ONLY): I-stat hCG, quantitative: >2000 m[IU]/mL — ABNORMAL HIGH (ref <5)

## 2022-09-28 MED ORDER — METOCLOPRAMIDE HCL 5 MG/ML IJ SOLN
10.0000 mg | Freq: Once | INTRAMUSCULAR | Status: AC
  Administered 2022-09-28: 10 mg via INTRAVENOUS
  Filled 2022-09-28: qty 2

## 2022-09-28 MED ORDER — SODIUM CHLORIDE 0.9 % IV BOLUS
1000.0000 mL | Freq: Once | INTRAVENOUS | Status: AC
Start: 2022-09-28 — End: 2022-09-28
  Administered 2022-09-28: 1000 mL via INTRAVENOUS

## 2022-09-28 MED ORDER — METOCLOPRAMIDE HCL 10 MG PO TABS: 10.0000 mg | ORAL_TABLET | Freq: Four times a day (QID) | ORAL | 0 refills | Status: DC

## 2022-09-28 NOTE — ED Provider Notes (Signed)
[facility] Provider Note   CSN: [id number] Arrival date & time: 09/28/22  1217     History  Chief Complaint  Patient presents with   Abdominal Pain   Emesis   Diarrhea    Dawn Moody is a 27 y.o. female.  Patient with no pertinent past medical history presents today with complaints of nausea, vomiting, and diarrhea.  She states that same began yesterday afternoon and has been persistent since then.  She states she ate at [employer] for lunch with her family and that her family is all sick with similar symptoms.  She endorses some mild generalized abdominal cramping but denies any overt abdominal pain.  She has had several positive pregnancy tests at home and suspect she is approximately [redacted] weeks gestation.  She states that she went to a clinic to have an abortion and was told that she was too far along and therefore is not followed up with OB/GYN yet.  She does have an appointment at the beginning of April. She denies any vaginal bleeding or discharge. She is G2P1. No fevers or chills, hematuria or dysuria.  The history is provided by the patient. No language interpreter was used.  Abdominal Pain Associated symptoms: diarrhea, nausea and vomiting   Emesis Associated symptoms: abdominal pain and diarrhea   Diarrhea Associated symptoms: abdominal pain and vomiting        Home Medications Prior to Admission medications   Medication Sig Start Date End Date Taking? Authorizing Provider  norelgestromin-ethinyl estradiol Marilu Favre) 150-35 MCG/24HR transdermal patch Place 1 patch onto the skin once a week. 02/16/22   Chancy Milroy, MD  Prenatal Vit-Fe Fumarate-FA (PRENATAL VITAMIN) 27-0.8 MG TABS Take 1 tablet by mouth daily. 09/25/22   Radene Gunning, MD      Allergies    Tylenol [acetaminophen]    Review of Systems   Review of Systems  Gastrointestinal:  Positive for abdominal pain, diarrhea, nausea and vomiting.  All other  systems reviewed and are negative.   Physical Exam Updated Vital Signs BP 113/76 (BP Location: Right Arm)   Pulse 97   Temp 98.9 F (37.2 C) (Oral)   Resp 16   Ht 5\' 9"  (1.753 m)   Wt 92 kg   LMP 04/23/2022   SpO2 100%   BMI 29.95 kg/m  Physical Exam Vitals and nursing note reviewed.  Constitutional:      General: She is not in acute distress.    Appearance: Normal appearance. She is normal weight. She is not ill-appearing, toxic-appearing or diaphoretic.  HENT:     Head: Normocephalic and atraumatic.  Cardiovascular:     Rate and Rhythm: Normal rate.  Pulmonary:     Effort: Pulmonary effort is normal. No respiratory distress.  Abdominal:     Palpations: Abdomen is soft.     Comments: Gravid abdomen soft nontender  Musculoskeletal:        General: Normal range of motion.     Cervical back: Normal range of motion.  Skin:    General: Skin is warm and dry.  Neurological:     General: No focal deficit present.     Mental Status: She is alert.  Psychiatric:        Mood and Affect: Mood normal.        Behavior: Behavior normal.    ED Results / Procedures / Treatments   Labs (all labs ordered are listed, but only abnormal results are displayed) Labs Reviewed  COMPREHENSIVE METABOLIC PANEL - Abnormal; Notable for the following components:      Result Value   Sodium 134 (*)    Potassium 3.4 (*)    CO2 19 (*)    Calcium 8.4 (*)    Albumin 3.3 (*)    All other components within normal limits  CBC WITH DIFFERENTIAL/PLATELET - Abnormal; Notable for the following components:   RBC 3.58 (*)    Hemoglobin 11.4 (*)    HCT 34.3 (*)    All other components within normal limits  I-STAT BETA HCG BLOOD, ED ([facility], [facility], AP ONLY) - Abnormal; Notable for the following components:   I-stat hCG, quantitative >2,000.0 (*)    All other components within normal limits  LIPASE, BLOOD  URINALYSIS, ROUTINE W REFLEX MICROSCOPIC    EKG None  Radiology US OB Limited > 14 wks  Result  Date: 09/28/2022 CLINICAL DATA:  pregnant abdominal pain EXAM: OBSTETRICAL ULTRASOUND >14 WKS FINDINGS: Limited evaluation performed demonstrating a single intrauterine pregnancy with an anterior placenta and adequate amniotic fluid in cephalic presentation. Fetal heart motion was present at a rate of 150 beats per minute. Anatomic assessment was not performed. Biometry measurements were not performed. A femur length of 4.2 cm corresponding to gestational age of [redacted] weeks 6 days. Ultrasound [facility] 01/19/2023. IMPRESSION: Very limited study was performed demonstrating a viable 23 weeks 6 day singleton pregnancy. There is subjectively adequate amniotic fluid. This examination does not include anatomic assessment or fetal biometry. Electronically Signed   By: Sammie Bench M.D.   On: 09/28/2022 14:03    Procedures Procedures    Medications Ordered in ED Medications  sodium chloride 0.9 % bolus 1,000 mL (0 mLs Intravenous Stopped 09/28/22 1533)  metoCLOPramide (REGLAN) injection 10 mg (10 mg Intravenous Given 09/28/22 1323)    ED Course/ Medical Decision Making/ A&P                             Medical Decision Making Amount and/or Complexity of Data Reviewed Labs: ordered. Radiology: ordered.  Risk Prescription drug management.   This patient is a 27 y.o. female who presents to the ED for concern of nausea, vomiting, diarrhea, this involves an extensive number of treatment options, and is a complaint that carries with it a high risk of complications and morbidity. The emergent differential diagnosis prior to evaluation includes, but is not limited to, ectopic pregnancy, gastroenteritis, appendicitis, Bowel obstruction, Bowel perforation. Gastroparesis, DKA, Hernia, Inflammatory bowel disease, pancreatitis. This is not an exhaustive differential.   Past Medical History / Co-morbidities / Social History: G2P1  Additional history: Chart reviewed. Pertinent results include: has not seen OB/GYN  for her current pregnancy  Physical Exam: Physical exam performed. The pertinent findings include: Gravid abdomen soft and nontender  Lab Tests: I ordered, and personally interpreted labs.  The pertinent results include:  Na 134, K 3.4, hgb 11.4, no other acute laboratory findings   Imaging Studies: I ordered imaging studies including US OB limited. I independently visualized and interpreted imaging which showed   Very limited study was performed demonstrating a viable 23 weeks 6 day singleton pregnancy. There is subjectively adequate amniotic fluid. This examination does not include anatomic assessment or fetal biometry.  I agree with the radiologist interpretation.   Medications: I ordered medication including fluids and reglan  for dehydration, nausea, and vomiting. Reevaluation of the patient after these medicines showed that the patient improved. I have reviewed the  patients home medicines and have made adjustments as needed.   Disposition:  Patient presents today with complaints of nausea, vomiting, and diarrhea. She is afebrile, non-toxic appearing, and in no acute distress with reassuring vital signs.  Physical exam reveals gravid abdomen that is soft and nontender.  Patient given that patient has not had an ultrasound, was obtained today that was reassuring for acute findings and showed viable IUP at 23 weeks 6 days.  After Reglan and fluids, patient states that her symptoms have completely resolved and she is ready to go home.  Suspect patient's symptoms are due to gastroenteritis especially considering her family was sick with similar symptoms.  No indication of appendicitis, bowel obstruction, bowel perforation, cholecystitis, diverticulitis, PID or ectopic pregnancy. Evaluation and diagnostic testing in the emergency department does not suggest an emergent condition requiring admission or immediate intervention beyond what has been performed at this time.  Plan for discharge with  close PCP follow-up. Also emphasized importance of establishing care with OB/GYN. Patient has an appointment scheduled in April. Will send for Reglan given her improvement today. Patient is understanding and amenable with plan, educated on red flag symptoms that would prompt immediate return.  Patient discharged in stable condition.   Final Clinical Impression(s) / ED Diagnoses Final diagnoses:  Nausea vomiting and diarrhea  [redacted] weeks gestation of pregnancy    Rx / DC Orders ED Discharge Orders          Ordered    metoCLOPramide (REGLAN) 10 MG tablet  Every 6 hours        09/28/22 1650          An After Visit Summary was printed and given to the patient.     Nestor Lewandowsky 09/28/22 1652    Carmin Muskrat, MD 09/29/22 [phone]

## 2022-09-28 NOTE — ED Notes (Signed)
U.S tech in room.

## 2022-09-28 NOTE — Discharge Instructions (Addendum)
As we discussed, your workup in the ER today was reassuring for acute findings.  Laboratory evaluation and ultrasound imaging did not reveal any emergent concerns.  I suspect that you have a viral GI infection or food poisoning.  Both of these should resolve in about 48 hours.  I have given you a prescription for Reglan for you to take as prescribed as needed for any residual nausea or vomiting.  Additionally, ultrasound imaging showed that you are approximately [redacted] weeks pregnant.  You need to follow-up with an OB/GYN to establish care at your earliest convenience.  Return if development of any new or worsening symptoms.

## 2022-09-28 NOTE — ED Notes (Signed)
ED Provider at bedside. 

## 2022-09-28 NOTE — ED Triage Notes (Signed)
Pt states she thinks she got food poisoning. Pt ate [employer] last night and everyone who ate it got sick. N/V/D since yesterday afternoon. Pt is [redacted]w[redacted]d pregnant.  Generalized abdominal cramping and hunger pains.

## 2022-11-02 ENCOUNTER — Telehealth (INDEPENDENT_AMBULATORY_CARE_PROVIDER_SITE_OTHER): Payer: Medicaid Other

## 2022-11-02 DIAGNOSIS — O0990 Supervision of high risk pregnancy, unspecified, unspecified trimester: Secondary | ICD-10-CM | POA: Insufficient documentation

## 2022-11-02 DIAGNOSIS — Z3480 Encounter for supervision of other normal pregnancy, unspecified trimester: Secondary | ICD-10-CM

## 2022-11-02 DIAGNOSIS — Z3689 Encounter for other specified antenatal screening: Secondary | ICD-10-CM

## 2022-11-02 NOTE — Patient Instructions (Signed)

## 2022-11-02 NOTE — Progress Notes (Signed)
New OB Intake  I connected with Dawn Moody  on 11/02/22 at  1:15 PM EDT by MyChart Video Visit and verified that I am speaking with the correct person using two identifiers. Nurse is located at [facility] and pt is located at home.  I discussed the limitations, risks, security and privacy concerns of performing an evaluation and management service by telephone and the availability of in person appointments. I also discussed with the patient that there may be a patient responsible charge related to this service. The patient expressed understanding and agreed to proceed.  I explained I am completing New OB Intake today. We discussed EDD of 01/28/2023 that is based on LMP of 04/23/2022. Pt is G4/P1. I reviewed her allergies, medications, Medical/Surgical/OB history, and appropriate screenings. I informed her of [facility] services. [facility] information placed in AVS. Based on history, this is a high risk pregnancy.  Patient Active Problem List   Diagnosis Date Noted   Post-operative state 02/16/2022   Food insecurity 02/16/2022   Contraceptive management 02/16/2022   Tobacco abuse 10/04/2020   Obesity    History of penicillin allergy 09/03/2020   History of syphilis 09/02/2020   Family history of breast cancer in mother 08/30/2020    Concerns addressed today  Delivery Plans Plans to deliver at [facility]. Patient given information for [facility] Healthy Baby website for more information about [facility]. Patient is not interested in water birth. Offered upcoming OB visit with CNM to discuss further.  MyChart/Babyscripts MyChart access verified. I explained pt will have some visits in office and some virtually. Babyscripts instructions given and order placed. Patient verifies receipt of registration text/e-mail. Account successfully created and app downloaded.  Blood Pressure Cuff/Weight Scale Blood pressure cuff ordered for patient to pick-up from [employer]. Explained after first  prenatal appt pt will check weekly and document in Babyscripts.  Anatomy US Explained first scheduled [place] will be around 19 weeks. Anatomy US scheduled for 12/08/2022 at 2:30pm. Pt notified to arrive at 2:15pm.  Labs Discussed Natera genetic screening with patient. Would like both Panorama and Horizon drawn at new OB visit. Routine prenatal labs needed.  COVID Vaccine Patient has not had COVID vaccine.    Is patient a [facility] candidate?  Not a candidate   If accepted, Mom+Baby staff notified  Social Determinants of Health Food Insecurity: Patient denies food insecurity. WIC Referral: Patient is interested in referral to [facility].  Transportation: Patient denies transportation needs. Childcare: Discussed no children allowed at ultrasound appointments. Offered childcare services; patient declines childcare services at this time.  Interested in [place]? If yes, send referral and doula dot phrase.   First visit review I reviewed new OB appt with patient. I explained they will have a provider visit that includes new ob labs and listening to baby heartbeat. Explained pt will be seen by Dr. Crissie Reese at first visit; encounter routed to appropriate provider. Explained that patient will be seen by pregnancy navigator following visit with provider.   Patient stated she was having spotting and cramps and was informed to go to [facility]U to examined. Patient verbalized understanding.    Lowry Bowl, CMA 11/02/2022  1:46 PM

## 2022-11-05 ENCOUNTER — Emergency Department (HOSPITAL_COMMUNITY)
Admission: EM | Admit: 2022-11-05 | Discharge: 2022-11-05 | Disposition: A | Discharge status: Home / Self Care | Payer: Medicaid Other | Attending: Emergency Medicine | Admitting: Emergency Medicine

## 2022-11-05 ENCOUNTER — Encounter (HOSPITAL_COMMUNITY): Payer: Self-pay

## 2022-11-05 DIAGNOSIS — O209 Hemorrhage in early pregnancy, unspecified: Secondary | ICD-10-CM | POA: Insufficient documentation

## 2022-11-05 DIAGNOSIS — Z3A29 29 weeks gestation of pregnancy: Secondary | ICD-10-CM | POA: Insufficient documentation

## 2022-11-05 DIAGNOSIS — Z3A27 27 weeks gestation of pregnancy: Secondary | ICD-10-CM | POA: Diagnosis not present

## 2022-11-05 DIAGNOSIS — O4690 Antepartum hemorrhage, unspecified, unspecified trimester: Secondary | ICD-10-CM

## 2022-11-05 LAB — URINALYSIS, ROUTINE W REFLEX MICROSCOPIC
Bilirubin Urine: NEGATIVE
Glucose, UA: NEGATIVE mg/dL
Hgb urine dipstick: NEGATIVE
Ketones, ur: NEGATIVE mg/dL
Leukocytes,Ua: NEGATIVE
Nitrite: NEGATIVE
Protein, ur: NEGATIVE mg/dL
Specific Gravity, Urine: 1.012 (ref 1.005–1.030)
pH: 7 (ref 5.0–8.0)

## 2022-11-05 LAB — WET PREP, GENITAL
Sperm: NONE SEEN
Trich, Wet Prep: NONE SEEN
WBC, Wet Prep HPF POC: <10 (ref <10)
Yeast Wet Prep HPF POC: NONE SEEN

## 2022-11-05 NOTE — Progress Notes (Signed)
Pt is a G4P1 at [redacted] weeks gestation by her last menstrual period. She gets her care at the [facility] on [address] here in [place]. They gave her an [facility] of July 21st. Her previous delivery was vaginal. She denies any problems with her previous pregnancy and says she hasn't had any problems with this pregnancy until 2 days ago when she said she had a pinkish d/c when she wiped. She denies having to wear a pad. She says she has occasional cramping. Her last visit with the women's center was virtual on 11/02/22. The pt is listed as high risk. She denies having any problems with her placenta.She says she has not had an ultrasound.

## 2022-11-05 NOTE — ED Triage Notes (Signed)
Pt arrives today c/o blood when wiping. Pt is [redacted] weeks pregnant. Pt is seen by women's center. This is pt's 5th pregnancy, 1 living child.   Pt c/o right sided uterus pressure.

## 2022-11-05 NOTE — ED Notes (Signed)
Rapid OB called  

## 2022-11-05 NOTE — ED Provider Notes (Signed)
[place] EMERGENCY DEPARTMENT AT [facility] Provider Note   CSN: [id number] Arrival date & time: 11/05/22  1154     History  Chief Complaint  Patient presents with   Vaginal Bleeding    Dawn Moody is a 27 y.o. female.  Patient who is pregnant, 29 weeks and 2 days per ultrasound performed in March 2024 --presents to the emergency department for evaluation of right lower abdominal pressure starting this morning as well as some intermittent spotting over the past 2 days including this morning.  Patient has not yet established care for her pregnancy but has an appointment in about a week.  She denies back pain.  She has good fetal movement.  Pain is described as a pressure in the right uterus area.  She describes the spotting as a pink color when she wipes.  She is fairly certain that it is not coming from the rectum and it is coming from the vagina.  No dysuria, increased frequency or urgency.       Home Medications Prior to Admission medications   Medication Sig Start Date End Date Taking? Authorizing Provider  metoCLOPramide (REGLAN) 10 MG tablet Take 1 tablet (10 mg total) by mouth every 6 (six) hours. 09/28/22   Smoot, Shawn Route, PA-C  norelgestromin-ethinyl estradiol [facility]) 150-35 MCG/24HR transdermal patch Place 1 patch onto the skin once a week. Patient not taking: Reported on 11/02/2022 02/16/22   [name], MD  Prenatal Vit-Fe Fumarate-FA (PRENATAL VITAMIN) 27-0.8 MG TABS Take 1 tablet by mouth daily. 09/25/22   Milas Hock, MD      Allergies    Tylenol [acetaminophen]    Review of Systems   Review of Systems  Physical Exam Updated Vital Signs BP 125/79 (BP Location: Right Arm)   Pulse 83   Temp 97.8 F (36.6 C) (Oral)   Resp 16   Ht 5\' 9"  (1.753 m)   Wt 91.6 kg   LMP 04/23/2022   SpO2 100%   BMI 29.83 kg/m   Physical Exam Vitals and nursing note reviewed.  Constitutional:      General: She is not in acute distress.    Appearance:  She is well-developed.  HENT:     Head: Normocephalic and atraumatic.     Right Ear: External ear normal.     Left Ear: External ear normal.     Nose: Nose normal.  Eyes:     Conjunctiva/sclera: Conjunctivae normal.  Cardiovascular:     Rate and Rhythm: Normal rate and regular rhythm.     Heart sounds: No murmur heard. Pulmonary:     Effort: No respiratory distress.     Breath sounds: No wheezing, rhonchi or rales.  Abdominal:     Palpations: Abdomen is soft.     Tenderness: There is no abdominal tenderness. There is no guarding or rebound.     Comments: Gravid  Musculoskeletal:     Cervical back: Normal range of motion and neck supple.     Right lower leg: No edema.     Left lower leg: No edema.  Skin:    General: Skin is warm and dry.     Findings: No rash.  Neurological:     General: No focal deficit present.     Mental Status: She is alert. Mental status is at baseline.     Motor: No weakness.  Psychiatric:        Mood and Affect: Mood normal.     ED Results /  Procedures / Treatments   Labs (all labs ordered are listed, but only abnormal results are displayed) Labs Reviewed  WET PREP, GENITAL - Abnormal; Notable for the following components:      Result Value   Clue Cells Wet Prep HPF POC PRESENT (*)    All other components within normal limits  URINALYSIS, ROUTINE W REFLEX MICROSCOPIC - Abnormal; Notable for the following components:   Color, Urine STRAW (*)    All other components within normal limits  GC/CHLAMYDIA PROBE AMP ([place]) NOT AT [facility]    EKG None  Radiology No results found.  Procedures Procedures    Medications Ordered in ED Medications - No data to display  ED Course/ Medical Decision Making/ A&P    Patient seen and examined. History obtained directly from patient.  [facility] has been notified by RN and they will see patient in the emergency room.  Waiting further instructions.  Labs/EKG: Ordered UA  Imaging: None  ordered.  Medications/Fluids: None ordered  Most recent vital signs reviewed and are as follows: BP 125/79 (BP Location: Right Arm)   Pulse 83   Temp 97.8 F (36.6 C) (Oral)   Resp 16   Ht 5\' 9"  (1.753 m)   Wt 91.6 kg   LMP 04/23/2022   SpO2 100%   BMI 29.83 kg/m   Initial impression: Possible vaginal spotting and pelvic pain in early third trimester pregnancy.  1:22 PM Reassessment performed. Patient appears stable.  [facility] at bedside.  Patient is not contracting and no concerning findings on tocometry.  They request UA, GC/chlamydia, wet prep.  Reviewed pertinent lab work and imaging with patient at bedside. Questions answered.   Most current vital signs reviewed and are as follows: BP 121/63 (BP Location: Right Arm)   Pulse 76   Temp 97.7 F (36.5 C) (Oral)   Resp 15   Ht 5\' 9"  (1.753 m)   Wt 91.6 kg   LMP 04/23/2022   SpO2 100%   BMI 29.83 kg/m   Plan: Additional testing as above.  2:55 PM Reassessment performed. Patient appears stable.  Labs personally reviewed and interpreted including: Wet prep negative but with clue cells, overall her history is not suggestive of BV.  UA negative without compelling signs of infection.  Reviewed pertinent lab work and imaging with patient at bedside. Questions answered.  Patient has been cleared by [facility] and OB/GYN.  She has upcoming follow-up on May 2.  She is encouraged to keep that appointment.  Most current vital signs reviewed and are as follows: BP 121/63 (BP Location: Right Arm)   Pulse 76   Temp 97.7 F (36.5 C) (Oral)   Resp 15   Ht 5\' 9"  (1.753 m)   Wt 91.6 kg   LMP 04/23/2022   SpO2 100%   BMI 29.83 kg/m   Plan: Discharge to home.   Prescriptions written for: None  Other home care instructions discussed: Rest, hydration  ED return instructions discussed: Return with development of severe pelvic pain, abdominal pain, back pain, vaginal bleeding or discharge especially if it is worsening.  We did  discuss [facility] as another alternative if she determines that she needs to return.  Follow-up instructions discussed: Patient encouraged to follow-up with their OB/GYN as planned, upcoming appointment in 5 days.                             Medical Decision Making  Amount and/or Complexity of Data Reviewed Labs: ordered.   Patient with reported vaginal spotting, mild right-sided lower pelvic pain.  Patient was assessed by OB/GYN and was on tocometry.  There was no evidence of contractions today and patient was cleared by OB/GYN.  UA, wet prep, GC and chlamydia sent per the request.  No evidence of UTI.  Low concern for PID.  Patient appears well, nontoxic.  She is comfortable.  She will continue use Tylenol as needed for discomfort.  She does have upcoming appointment on 5/2 to establish care.  The patient's vital signs, pertinent lab work and imaging were reviewed and interpreted as discussed in the ED course. Hospitalization was considered for further testing, treatments, or serial exams/observation. However as patient is well-appearing, has a stable exam, and reassuring studies today, I do not feel that they warrant admission at this time. This plan was discussed with the patient who verbalizes agreement and comfort with this plan and seems reliable and able to return to the Emergency Department with worsening or changing symptoms.          Final Clinical Impression(s) / ED Diagnoses Final diagnoses:  Vaginal bleeding in pregnancy    Rx / DC Orders ED Discharge Orders     None         Renne Crigler, PA-C 11/05/22 1457    Gloris Manchester, MD 11/05/22 1553

## 2022-11-05 NOTE — Progress Notes (Signed)
Spoke with Dr. Macon Large. Pt is a G4P1 at 29 2/[redacted] weeks gestation by an ultrasound that was done on 09/28/22. It also showed an anterior placenta. She gets her care at the [facility] here in [place]. The FHR is reactive and there are no contractions. No vaginal bleeding or leaking of fluid. The pt says she has had a pink vaginal d/c when she wipes x2 days. Orders received for a wet prep, GC culture, and an urinalysis. The pt can be seen at her regular appointment on 11/09/22.

## 2022-11-05 NOTE — Discharge Instructions (Signed)
Please read and follow all provided instructions.  Your diagnoses today include:  1. Vaginal bleeding in pregnancy    Tests performed today include: Urine test: no sign of UTI Wet prep: No yeast infection, no trichomoniasis, no bacterial vaginosis Vital signs. See below for your results today.   Medications prescribed:  Use Tylenol as needed for discomfort  Take any prescribed medications only as directed.  Home care instructions:  Follow any educational materials contained in this packet.  Follow-up instructions: Please follow-up with your OB/GYN this week as planned.  Return instructions:  Please return to the Emergency Department if you experience worsening symptoms.  Please return to the emergency room or go to the OB/GYN emergency room at [facility] if you have heavier vaginal bleeding, severe lower abdominal pain or back pain, develop vaginal discharge Please return if you have any other emergent concerns.  Additional Information:  Your vital signs today were: BP 121/63 (BP Location: Right Arm)   Pulse 76   Temp 97.7 F (36.5 C) (Oral)   Resp 15   Ht 5\' 9"  (1.753 m)   Wt 91.6 kg   LMP 04/23/2022   SpO2 100%   BMI 29.83 kg/m  If your blood pressure (BP) was elevated above 135/85 this visit, please have this repeated by your doctor within one month. --------------

## 2022-11-05 NOTE — Progress Notes (Signed)
I have reviewed the pt's results and she did have an ultrasound on 09/28/22. By ultrasound at that time she was 23 6/[redacted] weeks pregnant. Her EDC by ultrasound is 01/19/23. She does have an anterior placenta.

## 2022-11-06 LAB — GC/CHLAMYDIA PROBE AMP (~~LOC~~) NOT AT ARMC
Chlamydia: NEGATIVE
Comment: NEGATIVE
Comment: NORMAL
Neisseria Gonorrhea: NEGATIVE

## 2022-11-09 ENCOUNTER — Encounter: Payer: Medicaid Other | Admitting: Family Medicine

## 2022-12-06 ENCOUNTER — Encounter: Payer: Self-pay | Admitting: *Deleted

## 2022-12-08 ENCOUNTER — Ambulatory Visit: Payer: Medicaid Other | Attending: Family Medicine

## 2022-12-08 ENCOUNTER — Encounter: Payer: Self-pay | Admitting: *Deleted

## 2022-12-08 ENCOUNTER — Ambulatory Visit: Payer: Medicaid Other | Admitting: *Deleted

## 2022-12-08 VITALS — BP 130/67 | HR 90

## 2022-12-08 DIAGNOSIS — O0933 Supervision of pregnancy with insufficient antenatal care, third trimester: Secondary | ICD-10-CM

## 2022-12-08 DIAGNOSIS — O0990 Supervision of high risk pregnancy, unspecified, unspecified trimester: Secondary | ICD-10-CM | POA: Insufficient documentation

## 2022-12-08 DIAGNOSIS — Z3A32 32 weeks gestation of pregnancy: Secondary | ICD-10-CM | POA: Diagnosis not present

## 2022-12-13 ENCOUNTER — Encounter: Payer: Medicaid Other | Admitting: Certified Nurse Midwife

## 2022-12-20 ENCOUNTER — Inpatient Hospital Stay (HOSPITAL_COMMUNITY)
Admission: AD | Admit: 2022-12-20 | Discharge: 2022-12-21 | Disposition: A | Discharge status: Home / Self Care | Payer: Medicaid Other | Attending: Obstetrics and Gynecology | Admitting: Obstetrics and Gynecology

## 2022-12-20 ENCOUNTER — Other Ambulatory Visit: Payer: Medicaid Other

## 2022-12-20 ENCOUNTER — Ambulatory Visit: Payer: Medicaid Other | Admitting: Certified Nurse Midwife

## 2022-12-20 DIAGNOSIS — F419 Anxiety disorder, unspecified: Secondary | ICD-10-CM | POA: Insufficient documentation

## 2022-12-20 DIAGNOSIS — R109 Unspecified abdominal pain: Secondary | ICD-10-CM | POA: Insufficient documentation

## 2022-12-20 DIAGNOSIS — O99343 Other mental disorders complicating pregnancy, third trimester: Secondary | ICD-10-CM | POA: Insufficient documentation

## 2022-12-20 DIAGNOSIS — M940 Chondrocostal junction syndrome [Tietze]: Secondary | ICD-10-CM

## 2022-12-20 DIAGNOSIS — Z87891 Personal history of nicotine dependence: Secondary | ICD-10-CM | POA: Insufficient documentation

## 2022-12-20 DIAGNOSIS — Z3689 Encounter for other specified antenatal screening: Secondary | ICD-10-CM

## 2022-12-20 DIAGNOSIS — O0933 Supervision of pregnancy with insufficient antenatal care, third trimester: Secondary | ICD-10-CM

## 2022-12-20 DIAGNOSIS — Z3A34 34 weeks gestation of pregnancy: Secondary | ICD-10-CM

## 2022-12-20 DIAGNOSIS — O0993 Supervision of high risk pregnancy, unspecified, third trimester: Secondary | ICD-10-CM

## 2022-12-20 DIAGNOSIS — O99513 Diseases of the respiratory system complicating pregnancy, third trimester: Secondary | ICD-10-CM | POA: Insufficient documentation

## 2022-12-20 DIAGNOSIS — J45909 Unspecified asthma, uncomplicated: Secondary | ICD-10-CM | POA: Insufficient documentation

## 2022-12-20 DIAGNOSIS — O26893 Other specified pregnancy related conditions, third trimester: Secondary | ICD-10-CM | POA: Insufficient documentation

## 2022-12-20 NOTE — Progress Notes (Signed)
Called pt 12/19/22 at 5:30 PM to offer GTT during visit on 12/19/22. Pt agreeable. Reviewed need to fast starting at midnight and arrive at 0820.

## 2022-12-20 NOTE — MAU Note (Signed)
.  Dawn Moody is a 27 y.o. at [redacted]w[redacted]d here in [facility] reporting: SOB that is painful to take a deep breath and ABD pressure in her ribs and with CTX, pt states it has been increasing in intensity since last night. Pt took an Aleve this am around 1030 wit no relief.  Pt endorses FM Pt denies VB, LOF, abnormal discharge, PIH s/s, and complications in the pregnancy.   Onset of complaint: yesterday night  Pain score: 9/10 Vitals:   12/20/22 2354  BP: 120/66  Pulse: (!) 101  Resp: (!) 24  Temp: 99.9 F (37.7 C)  SpO2: 100%     FHT:165 Lab orders placed from triage:  UA

## 2022-12-21 ENCOUNTER — Encounter (HOSPITAL_COMMUNITY): Payer: Self-pay | Admitting: Obstetrics and Gynecology

## 2022-12-21 DIAGNOSIS — F419 Anxiety disorder, unspecified: Secondary | ICD-10-CM | POA: Diagnosis not present

## 2022-12-21 DIAGNOSIS — Z3A34 34 weeks gestation of pregnancy: Secondary | ICD-10-CM | POA: Diagnosis not present

## 2022-12-21 DIAGNOSIS — O99513 Diseases of the respiratory system complicating pregnancy, third trimester: Secondary | ICD-10-CM | POA: Diagnosis not present

## 2022-12-21 DIAGNOSIS — Z3689 Encounter for other specified antenatal screening: Secondary | ICD-10-CM | POA: Diagnosis not present

## 2022-12-21 DIAGNOSIS — Z87891 Personal history of nicotine dependence: Secondary | ICD-10-CM | POA: Diagnosis not present

## 2022-12-21 DIAGNOSIS — O99343 Other mental disorders complicating pregnancy, third trimester: Secondary | ICD-10-CM | POA: Diagnosis not present

## 2022-12-21 DIAGNOSIS — J45909 Unspecified asthma, uncomplicated: Secondary | ICD-10-CM | POA: Diagnosis not present

## 2022-12-21 DIAGNOSIS — M940 Chondrocostal junction syndrome [Tietze]: Secondary | ICD-10-CM | POA: Diagnosis not present

## 2022-12-21 DIAGNOSIS — O26893 Other specified pregnancy related conditions, third trimester: Secondary | ICD-10-CM | POA: Diagnosis present

## 2022-12-21 DIAGNOSIS — R109 Unspecified abdominal pain: Secondary | ICD-10-CM | POA: Diagnosis present

## 2022-12-21 LAB — URINALYSIS, ROUTINE W REFLEX MICROSCOPIC
Bilirubin Urine: NEGATIVE
Glucose, UA: NEGATIVE mg/dL
Hgb urine dipstick: NEGATIVE
Ketones, ur: NEGATIVE mg/dL
Leukocytes,Ua: NEGATIVE
Nitrite: NEGATIVE
Protein, ur: 30 mg/dL — AB
Specific Gravity, Urine: 1.02 (ref 1.005–1.030)
pH: 7 (ref 5.0–8.0)

## 2022-12-21 MED ORDER — CYCLOBENZAPRINE HCL 10 MG PO TABS: 10.0000 mg | ORAL_TABLET | Freq: Two times a day (BID) | ORAL | 0 refills | Status: DC | PRN

## 2022-12-21 MED ORDER — PROMETHAZINE HCL 25 MG PO TABS
25.0000 mg | ORAL_TABLET | Freq: Once | ORAL | Status: AC
  Administered 2022-12-21: 25 mg via ORAL
  Filled 2022-12-21: qty 1

## 2022-12-21 MED ORDER — MORPHINE SULFATE (PF) 4 MG/ML IV SOLN
4.0000 mg | Freq: Once | INTRAVENOUS | Status: AC
  Administered 2022-12-21: 4 mg via INTRAMUSCULAR
  Filled 2022-12-21: qty 1

## 2022-12-21 NOTE — MAU Provider Note (Signed)
History     CSN: [id number]  Arrival date and time: 12/20/22 2331   Event Date/Time   First Provider Initiated Contact with Patient 12/21/22 0034      Chief Complaint  Patient presents with   Shortness of Breath   Abdominal Pain    Dawn Moody is a 27 y.o. G4P1021 at [redacted]w[redacted]d who receives care at [facility].  She presents today for back, abdominal, and flank pain.  She states she feels "like the baby is too big."  She states the fetal movement is causing back discomfort, chest pain (d/t fetus "kicking me in the ribs") and pelvic pressure. She states this has been ongoing issue, but today it has "intensified."  She states it feels like her ribs are about to crack. She rates the pain a 8-9/10 and states that it causes her to not be able to take deep breaths. Patient states instead she breaths rapidly to help compensate for not being able to "catch my breath."   She endorses fetal movement and denies vaginal concerns.  Patient endorses a history of anxiety and admits to feeling somewhat panicked right now.   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 @[facility] , incomplete treatment with 7d doxy rather than 28d course (patient was previously incarcerated, and was then let out and had no way to get medication/no insurance)     08/30/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, 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, but given incomplete  treatment course and documented history of hives to PCN patient to be admitted for PCN desensitization and administra   Hypertension    Obesity    Post-operative state 02/16/2022   Tobacco abuse 10/04/2020    Past Surgical History:  Procedure Laterality Date   DILATION AND EVACUATION N/A 01/25/2022   Procedure: DILATATION AND EVACUATION;  Surgeon: [name], MD;  Location: [facility] OR;  Service: Gynecology;  Laterality: N/A;   TYMPANOSTOMY TUBE PLACEMENT     as a child    Family History  Problem Relation Age of Onset   Hypertension Mother    Breast cancer Mother    Cancer Mother    Hypertension Father    Diabetes Father    Cancer Father     Social History   Tobacco Use   Smoking status: Former    Packs/day: 0.30    Years: 10.00    Additional pack years: 0.00    Total pack years: 3.00    Types: Cigarettes   Smokeless tobacco: Current  Vaping Use   Vaping Use: Former   Substances: Nicotine   Devices: occassional  Substance Use Topics  Alcohol use: Not Currently    Comment: .18/23 on  antibiotics, before antibiotics 1 fifth every 2 days   Drug use: Yes    Frequency: 7.0 times per week    Types: Cocaine, Marijuana    Comment: marijuana. no cocaine since 01/12/22    Allergies:  Allergies  Allergen Reactions   Tylenol [Acetaminophen] Nausea And Vomiting    Medications Prior to Admission  Medication Sig Dispense Refill Last Dose   naproxen sodium (ALEVE) 220 MG tablet Take 220 mg by mouth as needed (acute pain).   12/20/2022 at 1030   Prenatal Vit-Fe Fumarate-FA (PRENATAL VITAMIN) 27-0.8 MG TABS Take 1 tablet by mouth daily. (Patient not taking: Reported on 12/08/2022) 30 tablet 11     Review of Systems  Cardiovascular:  Positive for chest pain (Rib area d/t fetal movement).  Gastrointestinal:  Positive for abdominal pain. Negative for constipation, diarrhea, nausea and vomiting.  Genitourinary:  Negative for difficulty urinating, dysuria, vaginal bleeding and  vaginal discharge.  Musculoskeletal:  Positive for back pain.  Neurological:  Negative for dizziness, light-headedness and headaches.   Physical Exam   Blood pressure 120/66, pulse (!) 101, temperature 99.9 F (37.7 C), resp. rate (!) 24, height 5\' 9"  (1.753 m), weight 108.9 kg, last menstrual period 04/23/2022, SpO2 100 %, unknown if currently breastfeeding.  Physical Exam Vitals reviewed.  Constitutional:      General: She is in acute distress.     Appearance: She is well-developed.  HENT:     Head: Normocephalic and atraumatic.  Eyes:     Conjunctiva/sclera: Conjunctivae normal.  Cardiovascular:     Rate and Rhythm: Tachycardia present.  Pulmonary:     Effort: Pulmonary effort is normal. Tachypnea present.     Breath sounds: Normal breath sounds. No decreased breath sounds.     Comments: Respirations 26 Abdominal:     Tenderness: There is abdominal tenderness.       Comments: X-Area of tenderness Abd gravid, but appears appropriate  Musculoskeletal:        General: Normal range of motion.     Cervical back: Normal range of motion.  Skin:    General: Skin is warm and dry.  Neurological:     Mental Status: She is alert and oriented to person, place, and time.  Psychiatric:        Mood and Affect: Mood normal.        Behavior: Behavior normal.      Fetal Assessment 155 bpm, Marked Var, -Decels, +Accels Toco: No ctx graphed  MAU Course   Results for orders placed or performed during the hospital encounter of 12/20/22 (from the past 24 hour(s))  Urinalysis, Routine w reflex microscopic -Urine, Clean Catch     Status: Abnormal   Collection Time: 12/21/22 12:04 AM  Result Value Ref Range   Color, Urine AMBER (A) YELLOW   APPearance CLOUDY (A) CLEAR   Specific Gravity, Urine 1.020 1.005 - 1.030   pH 7.0 5.0 - 8.0   Glucose, UA NEGATIVE NEGATIVE mg/dL   Hgb urine dipstick NEGATIVE NEGATIVE   Bilirubin Urine NEGATIVE NEGATIVE   Ketones, ur NEGATIVE NEGATIVE mg/dL    Protein, ur 30 (A) NEGATIVE mg/dL   Nitrite NEGATIVE NEGATIVE   Leukocytes,Ua NEGATIVE NEGATIVE   RBC / HPF 0-5 0 - 5 RBC/hpf   WBC, UA 6-10 0 - 5 WBC/hpf   Bacteria, UA RARE (A) NONE SEEN   Squamous Epithelial / HPF 0-5 0 - 5 /HPF   Mucus  PRESENT    No results found.  MDM PE Labs: UA  EFM Assessment and Plan  27 year old G4P1021  SIUP at 34.4 weeks Cat II FT Costochondritis  Anxiety  -Exam performed and findings discussed. -Informed that symptoms appear to be musculoskeletal in nature and educated on costochondritis. -Further informed that she appears to be having an anxiety attack.   -Discussed treatment with pain medication which should help with tachypnea and feelings of anxiety. Patient agreeable.  -Further discussed IV placement or oral fluids for hydration. Patient opts for oral method. -Agreeable to IM morphine. -Informed that in no improvement in pain or FHR will plan to proceed with IV placement, labs, and possibly chest x ray.   Cherre Robins MSN, CNM 12/21/2022, 12:34 AM   Reassessment (3:10 AM) -Patient asleep in bed. Respirations normal.  -EFM reactive and FHR in normal range. -Awakened by provider.  -Patient reports pain has improved, but is still present. -Discussed chest x ray and patient declines.  -Patient expresses concern with continued pain. Reviewed treatment for costochondritis including heat/cold applications, rest, and pain medications. -Reviewed home usage of flexeril for relief and increased ability to sleep. -Patient agreeable. -Rx sent to pharmacy per patient request-[facility] on [address].   -Precautions given. -Encouraged to call primary office or return to MAU if symptoms worsen or with the onset of new symptoms. -Discharged to home in improved condition.  Cherre Robins MSN, CNM Advanced Practice Provider, [facility] [employer]

## 2022-12-22 NOTE — Progress Notes (Signed)
Pt did not come to appt

## 2023-01-25 DIAGNOSIS — O0933 Supervision of pregnancy with insufficient antenatal care, third trimester: Secondary | ICD-10-CM | POA: Insufficient documentation

## 2023-01-25 DIAGNOSIS — F17210 Nicotine dependence, cigarettes, uncomplicated: Secondary | ICD-10-CM | POA: Diagnosis not present

## 2023-01-25 DIAGNOSIS — Z3A00 Weeks of gestation of pregnancy not specified: Secondary | ICD-10-CM | POA: Diagnosis not present

## 2023-01-25 DIAGNOSIS — O9902 Anemia complicating childbirth: Secondary | ICD-10-CM | POA: Diagnosis not present

## 2023-01-25 DIAGNOSIS — O99334 Smoking (tobacco) complicating childbirth: Secondary | ICD-10-CM | POA: Diagnosis not present

## 2023-01-25 DIAGNOSIS — Z3A39 39 weeks gestation of pregnancy: Secondary | ICD-10-CM | POA: Diagnosis not present

## 2023-01-25 DIAGNOSIS — Z8619 Personal history of other infectious and parasitic diseases: Secondary | ICD-10-CM | POA: Diagnosis not present

## 2023-01-26 DIAGNOSIS — Z3A39 39 weeks gestation of pregnancy: Secondary | ICD-10-CM | POA: Diagnosis not present

## 2023-01-27 DIAGNOSIS — Z30017 Encounter for initial prescription of implantable subdermal contraceptive: Secondary | ICD-10-CM | POA: Diagnosis not present

## 2023-01-28 ENCOUNTER — Inpatient Hospital Stay (HOSPITAL_COMMUNITY): Admission: AD | Admit: 2023-01-28 | Payer: Medicaid Other | Source: Home / Self Care

## 2023-05-28 ENCOUNTER — Encounter (HOSPITAL_COMMUNITY): Payer: Self-pay

## 2023-05-28 ENCOUNTER — Ambulatory Visit (HOSPITAL_COMMUNITY)
Admission: EM | Admit: 2023-05-28 | Discharge: 2023-05-28 | Disposition: A | Discharge status: Home / Self Care | Payer: Medicaid Other | Attending: Internal Medicine | Admitting: Internal Medicine

## 2023-05-28 DIAGNOSIS — J069 Acute upper respiratory infection, unspecified: Secondary | ICD-10-CM

## 2023-05-28 LAB — POCT FASTING CBG KUC MANUAL ENTRY: POCT Glucose (KUC): 93 mg/dL (ref 70–99)

## 2023-05-28 MED ORDER — GUAIFENESIN ER 600 MG PO TB12: 600.0000 mg | ORAL_TABLET | Freq: Two times a day (BID) | ORAL | 0 refills | Status: DC

## 2023-05-28 MED ORDER — IBUPROFEN 100 MG/5ML PO SUSP
ORAL | Status: AC
  Filled 2023-05-28: qty 30

## 2023-05-28 MED ORDER — FLUTICASONE PROPIONATE 50 MCG/ACT NA SUSP: 1.0000 | Freq: Every day | NASAL | 0 refills | Status: DC

## 2023-05-28 MED ORDER — BENZONATATE 100 MG PO CAPS: 100.0000 mg | ORAL_CAPSULE | Freq: Three times a day (TID) | ORAL | 0 refills | Status: DC | PRN

## 2023-05-28 MED ORDER — IBUPROFEN 600 MG PO TABS
600.0000 mg | ORAL_TABLET | Freq: Once | ORAL | Status: AC
  Administered 2023-05-28: 600 mg via ORAL

## 2023-05-28 NOTE — Discharge Instructions (Addendum)
Please maintain adequate hydration Use medications as directed If you have worsening symptoms please feel free to return to urgent care to be reevaluated There is no indication for viral testing or chest x-ray.

## 2023-05-28 NOTE — ED Triage Notes (Signed)
Here with my 2 kids. "I have a ha with sinus congestion, pressure and headaches for about a week, a lot of chills, shakey at times". No coughing or sneezing. "Some runny nose at times". Some overwhelming thirst. No abd pain. No dysuria. No urinary problems.

## 2023-05-30 NOTE — ED Provider Notes (Signed)
[facility]    CSN: [id number] Arrival date & time: 05/28/23  1240      History   Chief Complaint Chief Complaint  Patient presents with   Cough    Family of 3   Nasal Congestion    HPI Dawn Moody is a 27 y.o. female comes to the urgent care with nasal congestion, facial pressure and headache for about 2 weeks.  Patient complains of chills but denies any fever.  She has some cough but no shortness of breath.  No chest pain or chest tightness.  No nausea, vomiting or diarrhea.  Patient's children have similar symptoms.  No toxic inhalational exposures.  Patient complains of increased thirst and urination.  No dysuria, urgency or frequency.  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 @[facility] , incomplete treatment with 7d doxy rather than 28d course (patient was previously incarcerated, and was then let out and had no way to get medication/no insurance)     08/30/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, 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, but given incomplete treatment course and documented history of hives to PCN patient to be admitted for PCN desensitization and administra   Hypertension    Obesity    Post-operative state 02/16/2022   Tobacco abuse 10/04/2020    Patient Active Problem List   Diagnosis Date Noted   No prenatal care in current pregnancy in third trimester 01/25/2023   Normal labor 01/25/2023   Supervision of high risk pregnancy, antepartum 11/02/2022   Tobacco use 10/04/2020   Obesity    History of penicillin allergy 09/03/2020   History of syphilis  09/02/2020   Family history of breast cancer in mother 08/30/2020    Past Surgical History:  Procedure Laterality Date   DILATION AND EVACUATION N/A 01/25/2022   Procedure: DILATATION AND EVACUATION;  Surgeon: [name], MD;  Location: [facility];  Service: Gynecology;  Laterality: N/A;   TYMPANOSTOMY TUBE PLACEMENT     as a child    OB History     Gravida  4   Para  1   Term  1   Preterm  0   AB  2   Living  1      SAB  1   IAB  1   Ectopic  0   Multiple  0   Live Births  1            Home Medications    Prior to Admission medications   Medication Sig Start Date End Date Taking? Authorizing Provider  benzonatate (TESSALON) 100 MG capsule Take 1 capsule (100 mg total) by mouth 3 (three) times daily as needed for cough. 05/28/23  Yes Wilma Wuthrich, Britta Mccreedy, MD  fluticasone (FLONASE) 50 MCG/ACT nasal spray Place 1 spray into both nostrils daily. 05/28/23  Yes Freemon Binford, Britta Mccreedy, MD  guaiFENesin (MUCINEX) 600 MG 12 hr tablet Take 1 tablet (600 mg total) by mouth 2 (two) times daily. 05/28/23  Yes Aleynah Rocchio, Britta Mccreedy, MD  ibuprofen (ADVIL) 600 MG tablet  Take 1,200 mg by mouth 2 (two) times daily. 01/27/23  Yes [provider]  polyethylene glycol powder (GLYCOLAX/MIRALAX) 17 GM/SCOOP powder Take 1 Container by mouth daily. 01/27/23  Yes [provider]  cyclobenzaprine (FLEXERIL) 10 MG tablet Take 1 tablet (10 mg total) by mouth 2 (two) times daily as needed for muscle spasms. 12/21/22   Gerrit Heck, CNM  Prenatal Vit-Fe Fumarate-FA (PRENATAL VITAMIN) 27-0.8 MG TABS Take 1 tablet by mouth daily. Patient not taking: Reported on 12/08/2022 09/25/22   Milas Hock, MD    Family History Family History  Problem Relation Age of Onset   Hypertension Mother    Breast cancer Mother    Cancer Mother    Hypertension Father    Diabetes Father    Cancer Father     Social History Social History   Tobacco Use   Smoking status: Some Days    Current  packs/day: 0.30    Average packs/day: 0.3 packs/day for 10.0 years (3.0 ttl pk-yrs)    Types: Cigarettes   Smokeless tobacco: Never  Vaping Use   Vaping status: Former   Substances: Nicotine, Flavoring   Devices: occassional  Substance Use Topics   Alcohol use: Not Currently    Comment: .18/23 on  antibiotics, before antibiotics 1 fifth every 2 days   Drug use: Not Currently    Frequency: 7.0 times per week    Types: Cocaine, Marijuana    Comment: marijuana. no cocaine since 01/12/22     Allergies   Tylenol [acetaminophen]   Review of Systems Review of Systems As per HPI  Physical Exam Triage Vital Signs ED Triage Vitals  Encounter Vitals Group     BP 05/28/23 1408 126/78     Systolic BP Percentile --      Diastolic BP Percentile --      Pulse Rate 05/28/23 1408 100     Resp 05/28/23 1408 18     Temp 05/28/23 1408 99.1 F (37.3 C)     Temp Source 05/28/23 1408 Oral     SpO2 05/28/23 1408 97 %     Weight 05/28/23 1359 220 lb (99.8 kg)     Height 05/28/23 1359 5\' 9"  (1.753 m)     Head Circumference --      Peak Flow --      Pain Score 05/28/23 1531 0     Pain Loc --      Pain Education --      Exclude from Growth Chart --    No data found.  Updated Vital Signs BP 126/78 (BP Location: Left Arm)   Pulse 96   Temp 99.1 F (37.3 C) (Oral)   Resp 18   Ht 5\' 9"  (1.753 m)   Wt 99.8 kg   LMP 05/26/2023 (Exact Date)   SpO2 97%   BMI 32.49 kg/m   Visual Acuity Right Eye Distance:   Left Eye Distance:   Bilateral Distance:    Right Eye Near:   Left Eye Near:    Bilateral Near:     Physical Exam Vitals and nursing note reviewed.  Constitutional:      General: She is not in acute distress.    Appearance: She is not ill-appearing.  HENT:     Right Ear: Tympanic membrane normal.     Left Ear: Tympanic membrane normal.     Mouth/Throat:     Mouth: Mucous membranes are moist.     Pharynx: No oropharyngeal exudate or posterior oropharyngeal erythema.  Cardiovascular:     Rate and Rhythm: Normal rate and regular rhythm.     Pulses: Normal pulses.     Heart sounds: Normal heart sounds.  Pulmonary:     Effort: Pulmonary effort is normal.     Breath sounds: Normal breath sounds.  Abdominal:     General: Bowel sounds are normal.     Palpations: Abdomen is soft.  Neurological:     Mental Status: She is alert.      UC Treatments / Results  Labs (all labs ordered are listed, but only abnormal results are displayed) Labs Reviewed  POCT FASTING CBG [facility] MANUAL ENTRY    EKG   Radiology No results found.  Procedures Procedures (including critical care time)  Medications Ordered in UC Medications  ibuprofen (ADVIL) tablet 600 mg (600 mg Oral Given 05/28/23 1527)    Initial Impression / Assessment and Plan / UC Course  I have reviewed the triage vital signs and the nursing notes.  Pertinent labs & imaging results that were available during my care of the patient were reviewed by me and considered in my medical decision making (see chart for details).     1.  Viral URI with cough: Mucinex 600 mg twice daily as needed Fluticasone nasal spray at bedtime Humidifier VapoRub use will help with nasal congestion No viral testing indicated because of duration of symptoms Tessalon Perles 100 mg 3 times daily as needed for cough Return precautions given. Final Clinical Impressions(s) / UC Diagnoses   Final diagnoses:  Viral URI with cough     Discharge Instructions      Please maintain adequate hydration Use medications as directed If you have worsening symptoms please feel free to return to urgent care to be reevaluated There is no indication for viral testing or chest x-ray.   ED Prescriptions     Medication Sig Dispense Auth. Provider   guaiFENesin (MUCINEX) 600 MG 12 hr tablet Take 1 tablet (600 mg total) by mouth 2 (two) times daily. 20 tablet Allante Whitmire, Britta Mccreedy, MD   fluticasone (FLONASE) 50 MCG/ACT nasal spray  Place 1 spray into both nostrils daily. 16 g Danuta Huseman, Britta Mccreedy, MD   benzonatate (TESSALON) 100 MG capsule Take 1 capsule (100 mg total) by mouth 3 (three) times daily as needed for cough. 21 capsule Dorean Daniello, Britta Mccreedy, MD      PDMP not reviewed this encounter.   Merrilee Jansky, MD 05/30/23 [phone]

## 2023-06-16 IMAGING — US US OB TRANSVAGINAL
1 series · 15 of 28 positions shown · non-contrast
Comparison: None relevant; CT Abdomen and Pelvis 05/02/2019

CLINICAL DATA: 25-year-old female with bleeding and cramping
following medication abortion. Estimated gestational age by LMP 11
weeks and 0 days.

EXAM:
TRANSVAGINAL OB ULTRASOUND
TECHNIQUE: Transvaginal ultrasound was performed for complete evaluation of the
gestation as well as the maternal uterus, adnexal regions, and
pelvic cul-de-sac.

[Series 1: us ob transvaginal · 15 of 34 slices shown]
[im 1/34]
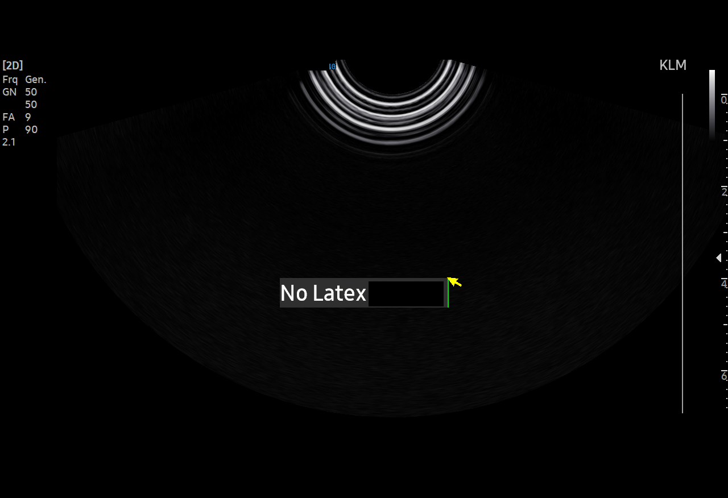
[im 3/34]
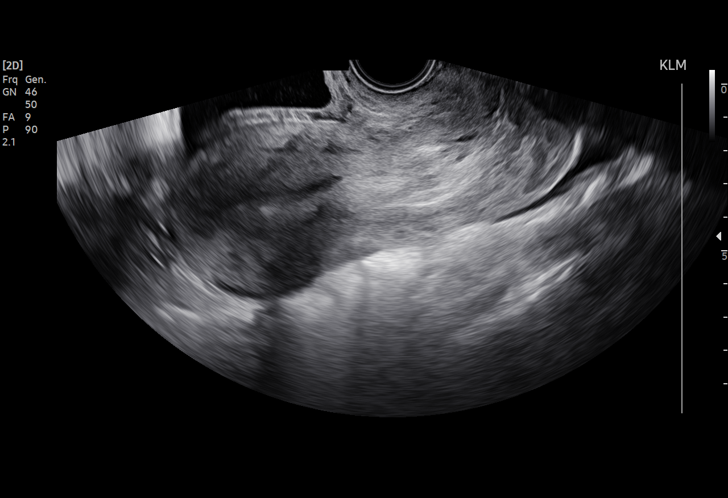
[im 5/34]
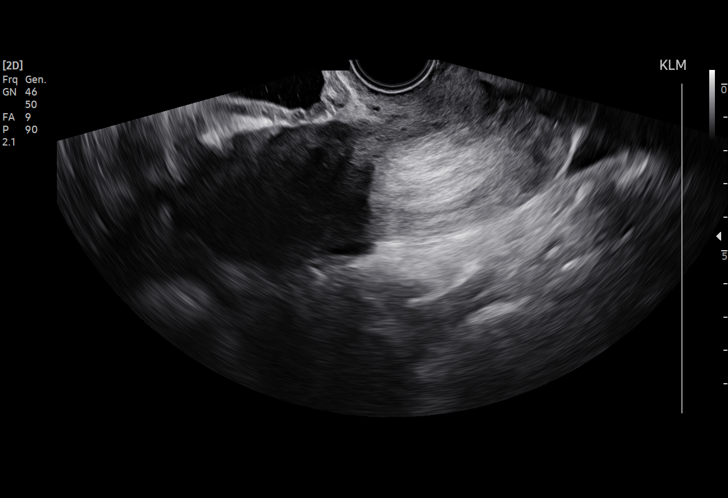
[im 8/34]
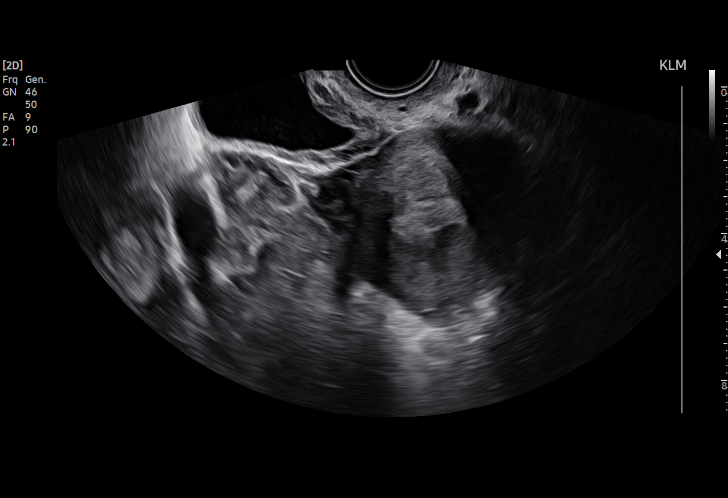
[im 10/34]
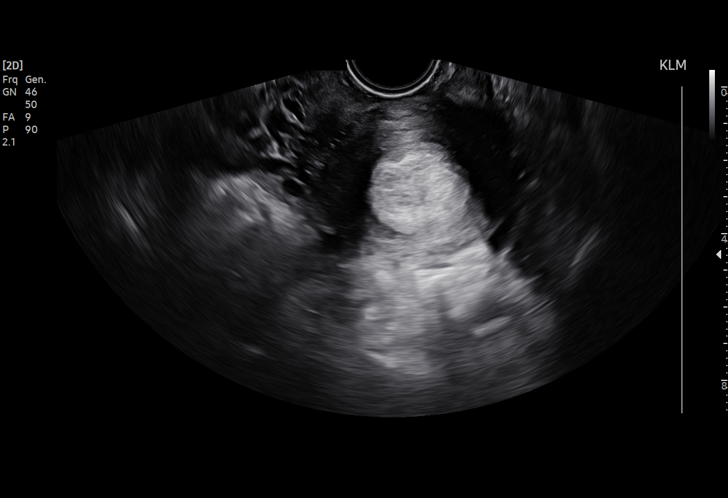
[im 13/34]
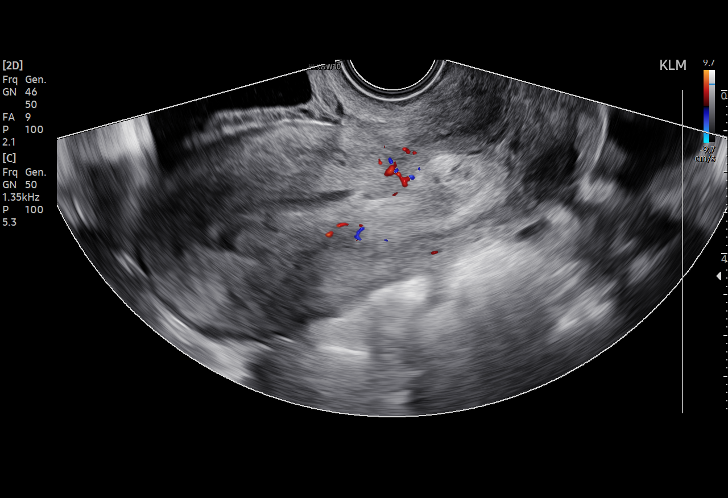
[im 15/34]
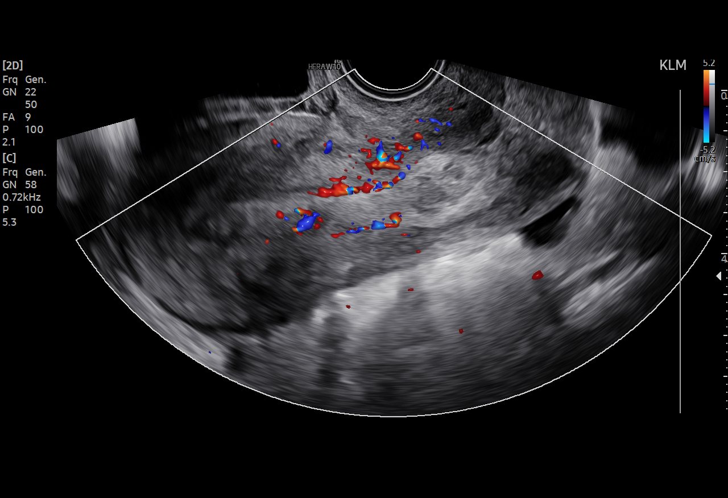
[im 18/34]
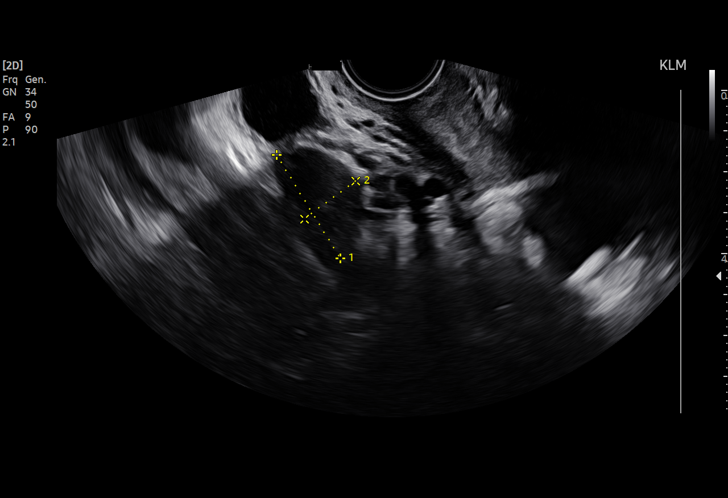
[im 19/34]
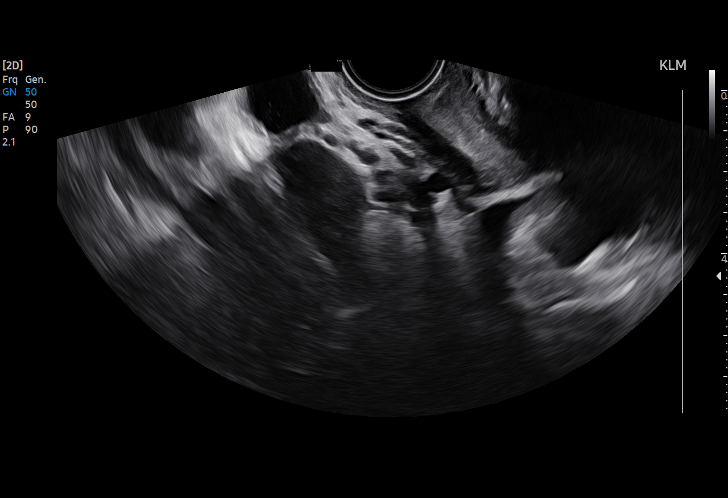
[im 21/34]
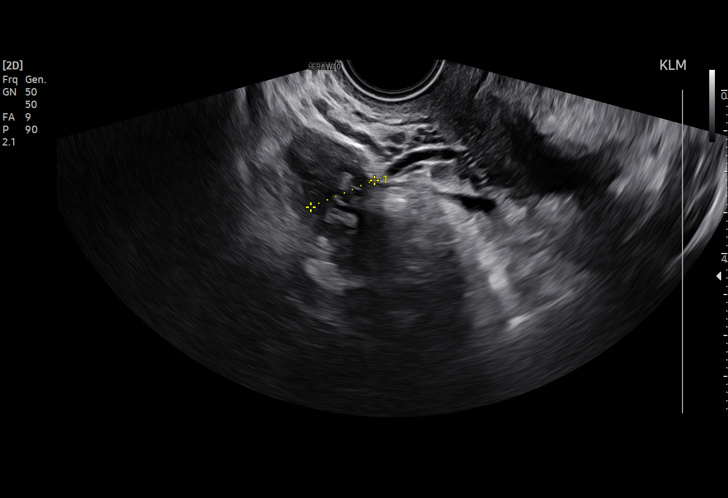
[im 24/34]
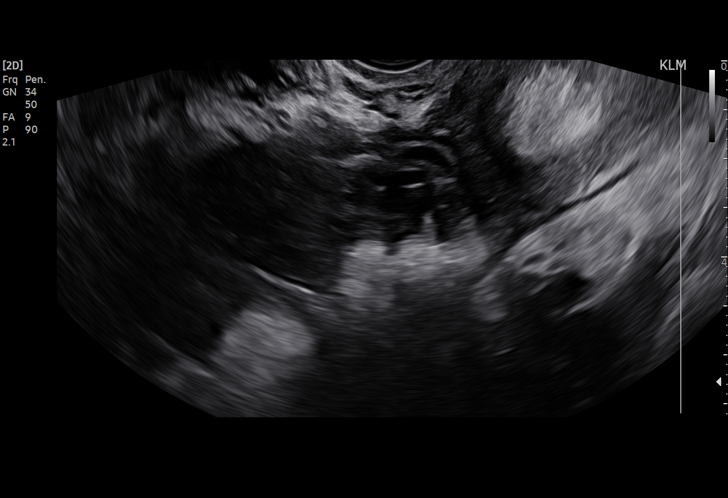
[im 26/34]
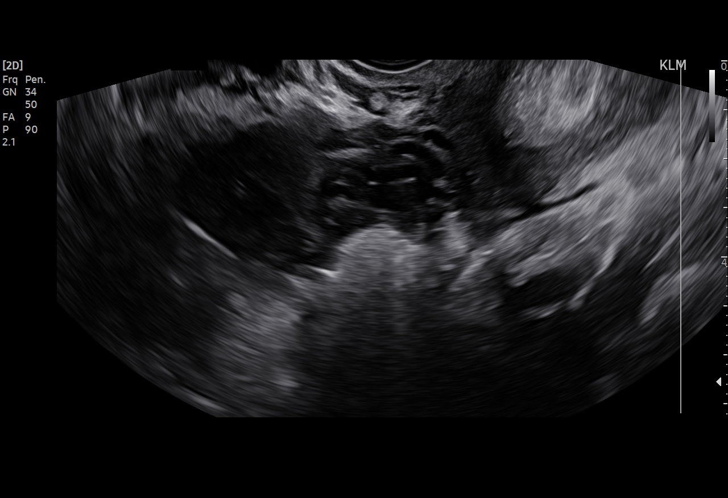
[im 29/34]
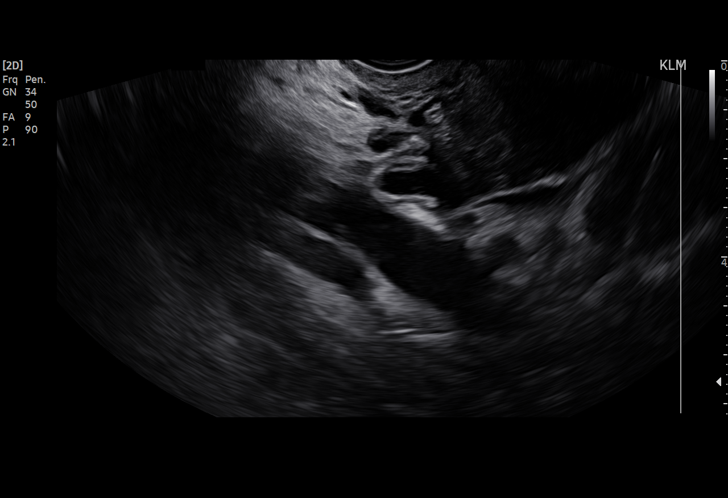
[im 31/34]
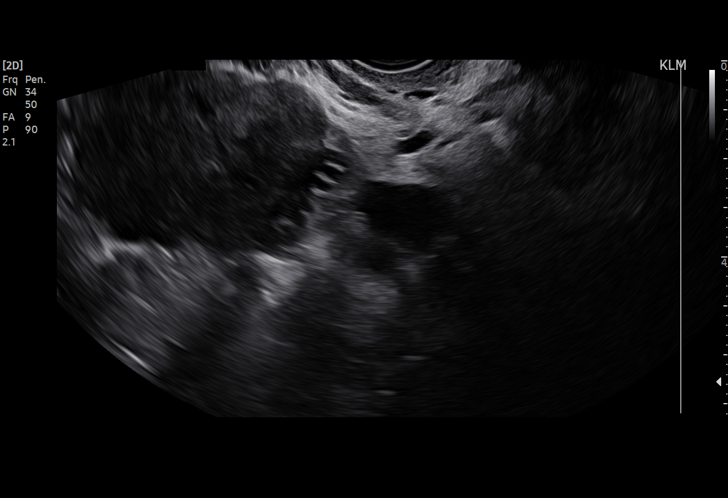
[im 34/34]
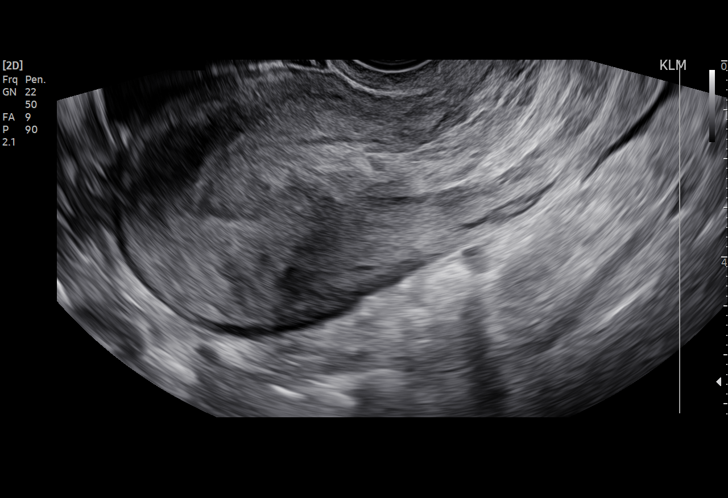

[15 of 28 positions shown; findings below may reference images not displayed]

FINDINGS: Intrauterine gestational sac: None.

Maternal uterus/adnexae: Heterogeneous, mixed echogenic appearing
material in the lower endometrial canal up to 20 mm in thickness
(image 13). This area is hypovascular on color Doppler (image 16).
Elsewhere the endometrium is thickened up to 18 mm.

No pelvic free fluid. The right ovary is normal measuring 1.7 x
x 1.6 cm. The left ovary is normal measuring 2.9 x 1.7 x 1.7 cm.

There is echogenic debris within the urinary bladder on image 8.
IMPRESSION: 1. No IUP, adnexal mass, or pelvic free fluid. Heterogeneous,
hypovascular material in the lower endometrial canal is probably
abortion in progress in this setting.

2. Urinary debris within the bladder.  Correlate with urinalysis.

## 2023-09-15 ENCOUNTER — Other Ambulatory Visit: Payer: Self-pay

## 2023-09-15 ENCOUNTER — Ambulatory Visit (HOSPITAL_COMMUNITY)
Admission: EM | Admit: 2023-09-15 | Discharge: 2023-09-15 | Disposition: A | Discharge status: Home / Self Care | Attending: Emergency Medicine | Admitting: Emergency Medicine

## 2023-09-15 ENCOUNTER — Encounter (HOSPITAL_COMMUNITY): Payer: Self-pay | Admitting: Emergency Medicine

## 2023-09-15 DIAGNOSIS — R519 Headache, unspecified: Secondary | ICD-10-CM | POA: Diagnosis not present

## 2023-09-15 MED ORDER — IBUPROFEN 800 MG PO TABS
800.0000 mg | ORAL_TABLET | Freq: Once | ORAL | Status: AC
  Administered 2023-09-15: 800 mg via ORAL

## 2023-09-15 MED ORDER — IBUPROFEN 800 MG PO TABS: 800.0000 mg | ORAL_TABLET | Freq: Three times a day (TID) | ORAL | 0 refills | Status: DC

## 2023-09-15 MED ORDER — IBUPROFEN 800 MG PO TABS
ORAL_TABLET | ORAL | Status: AC
  Filled 2023-09-15: qty 1

## 2023-09-15 NOTE — ED Provider Notes (Signed)
 [facility]    CSN: [id number] Arrival date & time: 09/15/23  1306     History   Chief Complaint Chief Complaint  Patient presents with   Headache    HPI Dawn Moody is a 28 y.o. female.  Here with 2-day history of headache, fatigue, some nausea.  She is currently rating 6/10 pain. Improved from 2 nights ago when it was the worst. She used tylenol once but no meds yet today. No fevers. No neck stiffness. 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 @[facility] , incomplete treatment with 7d doxy rather than 28d course (patient was previously incarcerated, and was then let out and had no way to get medication/no insurance)     08/30/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, 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, but given incomplete treatment course and documented history of hives to PCN patient to be admitted for PCN desensitization and administra   Hypertension    Obesity    Post-operative state 02/16/2022   Tobacco abuse 10/04/2020    Patient Active Problem List   Diagnosis Date Noted   No prenatal care in current pregnancy in third trimester 01/25/2023   Normal labor 01/25/2023   Supervision of high risk pregnancy, antepartum 11/02/2022   Tobacco use 10/04/2020   Obesity    History of penicillin allergy 09/03/2020   History of syphilis 09/02/2020   Family history of breast cancer in mother 08/30/2020    Past Surgical History:   Procedure Laterality Date   DILATION AND EVACUATION N/A 01/25/2022   Procedure: DILATATION AND EVACUATION;  Surgeon: [name], MD;  Location: [facility];  Service: Gynecology;  Laterality: N/A;   TYMPANOSTOMY TUBE PLACEMENT     as a child    OB History     Gravida  4   Para  1   Term  1   Preterm  0   AB  2   Living  1      SAB  1   IAB  1   Ectopic  0   Multiple  0   Live Births  1            Home Medications    Prior to Admission medications   Medication Sig Start Date End Date Taking? Authorizing Provider  ibuprofen (ADVIL) 800 MG tablet Take 1 tablet (800 mg total) by mouth 3 (three) times daily. 09/15/23  Yes Kieran Arreguin, Lurena Joiner, PA-C  benzonatate (TESSALON) 100 MG capsule Take 1 capsule (100 mg total) by mouth 3 (three) times daily as needed for cough. 05/28/23   Lamptey, Britta Mccreedy, MD  cyclobenzaprine (FLEXERIL) 10 MG tablet Take 1 tablet (10 mg total) by mouth 2 (two) times daily as needed for muscle spasms. 12/21/22   Gerrit Heck, CNM  fluticasone (FLONASE) 50 MCG/ACT nasal spray Place 1 spray into both nostrils daily. 05/28/23  Merrilee Jansky, MD  guaiFENesin (MUCINEX) 600 MG 12 hr tablet Take 1 tablet (600 mg total) by mouth 2 (two) times daily. 05/28/23   Lamptey, Britta Mccreedy, MD  polyethylene glycol powder (GLYCOLAX/MIRALAX) 17 GM/SCOOP powder Take 1 Container by mouth daily. 01/27/23   [provider]  Prenatal Vit-Fe Fumarate-FA (PRENATAL VITAMIN) 27-0.8 MG TABS Take 1 tablet by mouth daily. Patient not taking: Reported on 12/08/2022 09/25/22   Milas Hock, MD    Family History Family History  Problem Relation Age of Onset   Hypertension Mother    Breast cancer Mother    Cancer Mother    Hypertension Father    Diabetes Father    Cancer Father     Social History Social History   Tobacco Use   Smoking status: Some Days    Current packs/day: 0.30    Average packs/day: 0.3 packs/day for 10.0 years (3.0 ttl pk-yrs)    Types:  Cigarettes   Smokeless tobacco: Never  Vaping Use   Vaping status: Former   Substances: Nicotine, Flavoring   Devices: occassional  Substance Use Topics   Alcohol use: Not Currently    Comment: .18/23 on  antibiotics, before antibiotics 1 fifth every 2 days   Drug use: Not Currently    Frequency: 7.0 times per week    Types: Cocaine, Marijuana    Comment: marijuana. no cocaine since 01/12/22     Allergies   Tylenol [acetaminophen]   Review of Systems Review of Systems  Neurological:  Positive for headaches.   Per HPI  Physical Exam Triage Vital Signs ED Triage Vitals  Encounter Vitals Group     BP 09/15/23 1349 122/77     Systolic BP Percentile --      Diastolic BP Percentile --      Pulse Rate 09/15/23 1346 73     Resp 09/15/23 1346 18     Temp 09/15/23 1346 98.2 F (36.8 C)     Temp Source 09/15/23 1346 Oral     SpO2 09/15/23 1346 98 %     Weight --      Height --      Head Circumference --      Peak Flow --      Pain Score 09/15/23 1348 7     Pain Loc --      Pain Education --      Exclude from Growth Chart --    No data found.  Updated Vital Signs BP 122/77 (BP Location: Right Arm)   Pulse 73   Temp 98.2 F (36.8 C) (Oral)   Resp 18   LMP 09/13/2023 (Exact Date)   SpO2 98%   Visual Acuity Right Eye Distance:   Left Eye Distance:   Bilateral Distance:    Right Eye Near:   Left Eye Near:    Bilateral Near:     Physical Exam Vitals and nursing note reviewed.  Constitutional:      General: She is not in acute distress. HENT:     Right Ear: Tympanic membrane and ear canal normal.     Left Ear: Tympanic membrane and ear canal normal.     Mouth/Throat:     Mouth: Mucous membranes are moist.     Pharynx: Oropharynx is clear.  Eyes:     Conjunctiva/sclera: Conjunctivae normal.     Pupils: Pupils are equal, round, and reactive to light.  Cardiovascular:     Rate and Rhythm: Normal rate and regular rhythm.  Pulses: Normal pulses.     Heart  sounds: Normal heart sounds.  Pulmonary:     Effort: Pulmonary effort is normal.     Breath sounds: Normal breath sounds.  Abdominal:     Palpations: Abdomen is soft.     Tenderness: There is no abdominal tenderness.  Musculoskeletal:        General: Normal range of motion.     Cervical back: Normal range of motion. No rigidity or tenderness.  Lymphadenopathy:     Cervical: No cervical adenopathy.  Skin:    General: Skin is warm and dry.  Neurological:     Mental Status: She is alert and oriented to person, place, and time.     Sensory: No sensory deficit.     Motor: No weakness.     Coordination: Coordination normal.     Gait: Gait normal.     UC Treatments / Results  Labs (all labs ordered are listed, but only abnormal results are displayed) Labs Reviewed - No data to display  EKG   Radiology No results found.  Procedures Procedures (including critical care time)  Medications Ordered in UC Medications  ibuprofen (ADVIL) tablet 800 mg (800 mg Oral Given 09/15/23 1436)    Initial Impression / Assessment and Plan / UC Course  I have reviewed the triage vital signs and the nursing notes.  Pertinent labs & imaging results that were available during my care of the patient were reviewed by me and considered in my medical decision making (see chart for details).  Stable vitals, overall well appearing, good physical exam Ibuprofen dose given in clinic Discussed continuing symptomatic and supportive care at home.  Patient discussed she does have nausea medicine that she can use if needed.  Also advised increasing fluids.  Monitoring symptoms.  She could have started a virus.  A note for work is provided.  Return precautions discussed.  Final Clinical Impressions(s) / UC Diagnoses   Final diagnoses:  Acute nonintractable headache, unspecified headache type     Discharge Instructions      Ibuprofen every 6 hours if needed for headache Drink lots of fluids!! Monitor  symptoms and return if needed A work note is attached      ED Prescriptions     Medication Sig Dispense Auth. Provider   ibuprofen (ADVIL) 800 MG tablet Take 1 tablet (800 mg total) by mouth 3 (three) times daily. 21 tablet Rae Sutcliffe, Lurena Joiner, PA-C      PDMP not reviewed this encounter.   Marlow Baars, [place] 09/15/23 1446

## 2023-09-15 NOTE — ED Triage Notes (Signed)
 Constant HA and poor appetite for the past 2 days, not been able to go to work. Pt needs a note for work.

## 2023-09-15 NOTE — ED Notes (Signed)
 Reviewed details of work note

## 2023-09-15 NOTE — Discharge Instructions (Addendum)
 Ibuprofen every 6 hours if needed for headache Drink lots of fluids!! Monitor symptoms and return if needed A work note is attached

## 2023-11-14 ENCOUNTER — Ambulatory Visit
Admission: EM | Admit: 2023-11-14 | Discharge: 2023-11-14 | Disposition: A | Discharge status: Home / Self Care | Attending: Internal Medicine | Admitting: Internal Medicine

## 2023-11-14 ENCOUNTER — Other Ambulatory Visit: Payer: Self-pay

## 2023-11-14 DIAGNOSIS — R300 Dysuria: Secondary | ICD-10-CM | POA: Insufficient documentation

## 2023-11-14 DIAGNOSIS — N898 Other specified noninflammatory disorders of vagina: Secondary | ICD-10-CM | POA: Insufficient documentation

## 2023-11-14 DIAGNOSIS — Z113 Encounter for screening for infections with a predominantly sexual mode of transmission: Secondary | ICD-10-CM | POA: Diagnosis not present

## 2023-11-14 LAB — POCT URINALYSIS DIP (MANUAL ENTRY)
Bilirubin, UA: NEGATIVE
Blood, UA: NEGATIVE
Glucose, UA: NEGATIVE mg/dL
Ketones, POC UA: NEGATIVE mg/dL
Leukocytes, UA: NEGATIVE
Nitrite, UA: NEGATIVE
Protein Ur, POC: NEGATIVE mg/dL
Spec Grav, UA: 1.025 (ref 1.010–1.025)
Urobilinogen, UA: 0.2 U/dL
pH, UA: 7 (ref 5.0–8.0)

## 2023-11-14 LAB — POCT URINE PREGNANCY: Preg Test, Ur: NEGATIVE

## 2023-11-14 NOTE — ED Triage Notes (Signed)
 Patient states she was possible exposed to a STD and requests testing.

## 2023-11-14 NOTE — ED Provider Notes (Signed)
 [facility]  Note:  This document was prepared using Dragon voice recognition software and may include unintentional dictation errors.  MRN: [id number] DOB: 12/10/1995 DATE: 11/14/23   Subjective:  Chief Complaint:  Chief Complaint  Patient presents with   Exposure to STD   HPI: Dawn Moody is a 28 y.o. female presenting for STD testing. Patient states a recent sexual partner called her and reported STD like symptoms.  She states that he is getting tested today.  She reports having sex with him about a week ago and the condom broke during the sexual encounter.  She states she has been asymptomatic until receiving the call earlier today.  She now reports some vaginal irritation and slight pruritus.  Reports no vaginal discharge or vaginal lesions.  She also reports slight burning with urination as well.  She is requesting full STD testing, but does have a history of syphilis back in 2018 that she received treatment for at that time.  Denies fever, nausea/vomiting, abdominal pain, vaginal discharge, vaginal odor, vaginal lesions. Endorses vaginal irritation, vaginal pruritus, dysuria. Presents NAD.  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 @[facility] , incomplete treatment with 7d doxy rather than 28d course (patient was previously incarcerated, and was then let out and had no way to get medication/no insurance)     08/30/20 +RPR  titer 1:4, 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, but given incomplete treatment course and documented history of hives to PCN patient to be admitted for PCN desensitization and administra   Hypertension    Obesity    Post-operative state 02/16/2022   Tobacco abuse 10/04/2020     Past Surgical History:  Procedure Laterality Date   DILATION AND EVACUATION N/A 01/25/2022   Procedure: DILATATION AND EVACUATION;  Surgeon: Othelia Blinks, MD;  Location: [facility];  Service: Gynecology;  Laterality: N/A;   TYMPANOSTOMY TUBE PLACEMENT     as a child    Family History  Problem Relation Age of Onset   Hypertension Mother    Breast cancer Mother    Cancer Mother    Hypertension Father    Diabetes Father    Cancer Father     Social History   Tobacco Use   Smokeless tobacco: Never  Vaping Use   Vaping status: Former   Substances: Nicotine, Flavoring   Devices: occassional  Substance Use Topics   Alcohol use: Not Currently    Comment: .18/23 on  antibiotics, before antibiotics 1 fifth every 2 days   Drug use: Not Currently    Frequency: 7.0 times per week    Types: Cocaine, Marijuana    Comment: marijuana. no cocaine since 01/12/22    Review of Systems  Constitutional:  Negative for fever.  Gastrointestinal:  Negative for abdominal pain, nausea and vomiting.  Genitourinary:  Positive for dysuria. Negative for flank pain, genital sores, hematuria  and vaginal discharge.     Objective:   Vitals: BP 103/71 (BP Location: Right Arm)   Pulse 80   Temp 98.8 F (37.1 C)   Resp 16   LMP 10/30/2023   SpO2 98%   Physical Exam Constitutional:      General: She is not in acute distress.    Appearance: Normal appearance. She is well-developed and normal weight. She is not ill-appearing or toxic-appearing.  HENT:     Head: Normocephalic and atraumatic.  Cardiovascular:     Rate and Rhythm: Normal rate and regular rhythm.     Heart sounds: Normal heart sounds.  Pulmonary:     Effort:  Pulmonary effort is normal.     Breath sounds: Normal breath sounds.     Comments: Clear to auscultation bilaterally  Abdominal:     General: Bowel sounds are normal.     Palpations: Abdomen is soft.     Tenderness: There is no abdominal tenderness.  Skin:    General: Skin is warm and dry.  Neurological:     General: No focal deficit present.     Mental Status: She is alert.  Psychiatric:        Mood and Affect: Mood and affect normal.     Results:  Labs: No results found for this or any previous visit (from the past 24 hours).  Radiology: No results found.   UC Course/Treatments:  Procedures: Procedures   Medications Ordered in UC: Medications - No data to display   Assessment and Plan :     ICD-10-CM   1. Screening for STDs (sexually transmitted diseases)  Z11.3 RPR    HIV Antibody (routine testing w rflx)    HIV4GL Save Tube    Cervicovaginal ancillary only    RPR    HIV Antibody (routine testing w rflx)    HIV4GL Save Tube    Cervicovaginal ancillary only    2. Vaginal irritation  N89.8     3. Dysuria  R30.0      Screening for STDs (sexually transmitted diseases) Afebrile, nontoxic-appearing, NAD. VSS. DDX includes but not limited to: BV, yeast, STD Cytology, HIV, and RPR pending.  Will adjust treatment plan based on results.  Safe sex precautions advised.  Strict ED precautions were given and patient verbalized understanding.  Vaginal Irritation Afebrile, nontoxic-appearing, NAD. VSS. DDX includes but not limited to: BV, yeast, STD Cytology is pending. Patient reports no discharge, but reports some irration.  She states symptoms did not start until she received the phone call. Will adjust treatment plan based on results.  Safe sex precautions advised. Strict ED precautions were given and patient verbalized understanding.  Dysuria Afebrile, nontoxic-appearing, NAD. VSS. DDX includes but not limited to: Cystitis, pyelonephritis, nephrolithiasis, STD,  BV, yeast UA and UPT were negative today in office. Cytology is pending. Safe sex precautions advised. Strict ED precautions were given and patient verbalized understanding.  ED Discharge Orders     None        PDMP not reviewed this encounter.     Avi Body, PA-C 11/14/23 1931

## 2023-11-14 NOTE — Discharge Instructions (Addendum)
 Your swab and blood work were sent to the lab for further testing.  You will be called with results. You should avoid all sexual activity until you have been notified of all your results and have undergone any necessary treatment.  If you are positive, it is recommended that you inform all sexual partners so they can treat be treated as well before having sex again.

## 2023-11-16 ENCOUNTER — Telehealth (HOSPITAL_COMMUNITY): Payer: Self-pay

## 2023-11-16 LAB — RPR, QUANT+TP ABS (REFLEX)
Rapid Plasma Reagin, Quant: 1:2 {titer} — ABNORMAL HIGH
T Pallidum Abs: REACTIVE — AB

## 2023-11-16 LAB — CERVICOVAGINAL ANCILLARY ONLY
Bacterial Vaginitis (gardnerella): POSITIVE — AB
Candida Glabrata: NEGATIVE
Candida Vaginitis: NEGATIVE
Chlamydia: NEGATIVE
Comment: NEGATIVE
Comment: NEGATIVE
Comment: NEGATIVE
Comment: NEGATIVE
Comment: NEGATIVE
Comment: NORMAL
Neisseria Gonorrhea: NEGATIVE
Trichomonas: NEGATIVE

## 2023-11-16 LAB — HIV ANTIBODY (ROUTINE TESTING W REFLEX): HIV Screen 4th Generation wRfx: NONREACTIVE

## 2023-11-16 LAB — RPR: RPR Ser Ql: REACTIVE — AB

## 2023-11-16 MED ORDER — METRONIDAZOLE 500 MG PO TABS: 500.0000 mg | ORAL_TABLET | Freq: Two times a day (BID) | ORAL | 0 refills | Status: AC

## 2023-11-16 NOTE — Telephone Encounter (Signed)
 Per protocol, pt requires tx with metronidazole. Rx sent to pharmacy on file.

## 2023-11-21 ENCOUNTER — Ambulatory Visit (HOSPITAL_COMMUNITY): Payer: Self-pay

## 2023-12-08 ENCOUNTER — Ambulatory Visit
Admission: EM | Admit: 2023-12-08 | Discharge: 2023-12-08 | Disposition: A | Discharge status: Home / Self Care | Attending: Internal Medicine | Admitting: Internal Medicine

## 2023-12-08 ENCOUNTER — Other Ambulatory Visit: Payer: Self-pay

## 2023-12-08 DIAGNOSIS — J020 Streptococcal pharyngitis: Secondary | ICD-10-CM | POA: Diagnosis not present

## 2023-12-08 DIAGNOSIS — R300 Dysuria: Secondary | ICD-10-CM | POA: Diagnosis not present

## 2023-12-08 DIAGNOSIS — R3129 Other microscopic hematuria: Secondary | ICD-10-CM | POA: Diagnosis not present

## 2023-12-08 DIAGNOSIS — N898 Other specified noninflammatory disorders of vagina: Secondary | ICD-10-CM | POA: Diagnosis not present

## 2023-12-08 LAB — POCT URINALYSIS DIP (MANUAL ENTRY)
Bilirubin, UA: NEGATIVE
Glucose, UA: NEGATIVE mg/dL
Ketones, POC UA: NEGATIVE mg/dL
Nitrite, UA: NEGATIVE
Protein Ur, POC: 30 mg/dL — AB
Spec Grav, UA: 1.02
Urobilinogen, UA: 1 U/dL
pH, UA: 6.5

## 2023-12-08 LAB — POCT RAPID STREP A (OFFICE): Rapid Strep A Screen: POSITIVE — AB

## 2023-12-08 LAB — POCT URINE PREGNANCY: Preg Test, Ur: NEGATIVE

## 2023-12-08 MED ORDER — FLUCONAZOLE 150 MG PO TABS: 150.0000 mg | ORAL_TABLET | ORAL | 0 refills | Status: AC

## 2023-12-08 MED ORDER — AZITHROMYCIN 250 MG PO TABS
ORAL_TABLET | ORAL | 0 refills | Status: AC
Start: 2023-12-08 — End: ?

## 2023-12-08 NOTE — ED Provider Notes (Signed)
 [facility]  Note:  This document was prepared using Dragon voice recognition software and may include unintentional dictation errors.  MRN: [id number] DOB: 08/16/1995 DATE: 12/08/23   Subjective:   Chief Complaint:  Chief Complaint  Patient presents with   Vaginal Discharge     HPI: Dawn Moody is a 28 y.o. female presenting for multiple issues.  First, patient states that she is currently being treated for BV with flagyl . She was tested 3 weeks ago for STDs and only possible for BV at that time. Approximately 4-5 days ago, she started with vaginal itching and a thick, white discharge. Reports taking Monistat with some relief, but no resolution. She continues to have the thick discharge as well as vaginal irritation. She reports burning with urination as well. Patient reports LMP was April, but does have the Nexplanon inserted.   Second, she reports a sore throat starting 4-5 days ago as well. Reports pain with swallowing and feels that her lymph nodes are swollen. She does reports vomiting and nausea this morning. No known sick contacts.   Denies fever, cough, congestion, abdominal pain. Endorses sore throat, otalgia, nausea/vomiting, dysuria, vaginal discharge. Presents NAD.  Prior to Admission medications   Medication Sig Start Date End Date Taking? Authorizing Provider  metroNIDAZOLE  (FLAGYL ) 500 MG tablet Take 500 mg by mouth 2 (two) times daily.   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 @[facility] , incomplete treatment with 7d doxy rather than 28d course (patient was previously incarcerated, and was then let out and had no way to get medication/no insurance)     08/30/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, 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, but given incomplete treatment course and documented history of hives to PCN patient to be admitted for PCN desensitization and administra   Hypertension    Obesity    Post-operative state 02/16/2022   Tobacco abuse 10/04/2020     Past Surgical History:  Procedure Laterality Date   DILATION AND EVACUATION N/A 01/25/2022   Procedure: DILATATION AND EVACUATION;  Surgeon: Othelia Blinks, MD;  Location: [facility] OR;  Service: Gynecology;  Laterality: N/A;   TYMPANOSTOMY TUBE PLACEMENT     as a child    Family History  Problem Relation Age of Onset   Hypertension Mother    Breast cancer Mother    Cancer Mother    Hypertension Father    Diabetes Father    Cancer Father     Social History   Tobacco Use   Smokeless tobacco: Never  Vaping Use   Vaping status: Former   Substances: Nicotine, Flavoring   Devices: occassional  Substance Use Topics   Alcohol use: Not Currently    Comment: .18/23 on  antibiotics, before antibiotics 1 fifth every 2 days   Drug use: Not Currently    Frequency: 7.0 times per week    Types: Cocaine, Marijuana  Comment: marijuana. no cocaine since 01/12/22    Review of Systems  Constitutional:  Negative for fever.  HENT:  Positive for ear pain and sore throat. Negative for congestion and rhinorrhea.   Respiratory:  Negative for cough.   Gastrointestinal:  Positive for nausea and vomiting. Negative for abdominal pain.  Genitourinary:  Positive for dysuria and vaginal discharge. Negative for flank pain and hematuria.     Objective:   Vitals: BP 126/81 (BP Location: Left Arm)   Pulse 82   Temp 98.1 F (36.7 C) (Oral)   Resp 16   LMP 10/30/2023 (Approximate)    SpO2 96%   Physical Exam Constitutional:      General: She is not in acute distress.    Appearance: Normal appearance. She is well-developed and normal weight. She is not ill-appearing or toxic-appearing.  HENT:     Head: Normocephalic and atraumatic.     Right Ear: Ear canal normal. A middle ear effusion is present.     Left Ear: Ear canal normal. A middle ear effusion is present.     Mouth/Throat:     Pharynx: Uvula midline. Pharyngeal swelling, oropharyngeal exudate and posterior oropharyngeal erythema present.     Tonsils: Tonsillar exudate present.  Cardiovascular:     Rate and Rhythm: Normal rate and regular rhythm.     Heart sounds: Normal heart sounds.  Pulmonary:     Effort: Pulmonary effort is normal.     Breath sounds: Normal breath sounds.     Comments: Clear to auscultation bilaterally  Abdominal:     General: Bowel sounds are normal.     Palpations: Abdomen is soft.     Tenderness: There is no abdominal tenderness. There is no right CVA tenderness or left CVA tenderness.  Lymphadenopathy:     Cervical: Cervical adenopathy present.     Right cervical: Superficial cervical adenopathy present.  Skin:    General: Skin is warm and dry.  Neurological:     General: No focal deficit present.     Mental Status: She is alert.  Psychiatric:        Mood and Affect: Mood and affect normal.     Results:  Labs: Results for orders placed or performed during the hospital encounter of 12/08/23 (from the past 24 hours)  POCT rapid strep A     Status: Abnormal   Collection Time: 12/08/23  4:17 PM  Result Value Ref Range   Rapid Strep A Screen Positive (A)   POCT urinalysis dipstick     Status: Abnormal   Collection Time: 12/08/23  4:23 PM  Result Value Ref Range   Color, UA yellow    Clarity, UA clear    Glucose, UA negative mg/dL   Bilirubin, UA negative    Ketones, POC UA negative mg/dL   Spec Grav, UA 0.981    Blood, UA trace-lysed (A)    pH, UA 6.5    Protein Ur,  POC =30 (A) mg/dL   Urobilinogen, UA 1.0 E.U./dL   Nitrite, UA Negative    Leukocytes, UA Small (1+) (A)   POCT urine pregnancy     Status: Normal   Collection Time: 12/08/23  4:25 PM  Result Value Ref Range   Preg Test, Ur Negative     Radiology: No results found.   UC Course/Treatments:  Procedures: Procedures   Medications Ordered in UC: Medications - No data to display   Assessment and Plan :     ICD-10-CM   1.  Streptococcal sore throat  J02.0     2. Vaginal discharge  N89.8     3. Dysuria  R30.0 Urine Culture    Urine Culture    4. Other microscopic hematuria  R31.29 Urine Culture    Urine Culture     Streptococcal sore throat Afebrile, nontoxic-appearing, NAD. VSS. DDX includes but not limited to: strep, mono, viral pharyngitis, post nasal drip  Strep was positive today in office. Patient reports history of penicillin  allergy, but per her EMR has since been deleted. Patient unsure about Penicillin  status. Zithromax 250mg  as directed was prescribed. Recommend salt water  gargles and over-the-counter throat spray/lozenges. Strict ED precautions were given and patient verbalized understanding.  Vaginal discharge Afebrile, nontoxic-appearing, NAD. VSS. DDX includes but not limited to: BV, yeast, STD UPT was negative.  Currently on Flagyl  for BV, but started with new onset vaginal pruritus as well as thick, white discharge. Suspect yeast secondary to ABX use. Cytology is pending. Diflucan 150 mg every 72 hours was prescribed.  Safe sex precautions advised.  Will adjust treatment plan based on results strict ED precautions were given and patient verbalized understanding.  Dysuria Afebrile, nontoxic-appearing, NAD. VSS. DDX includes but not limited to: Cystitis, pyelonephritis, nephrolithiasis, STD, BV, yeast UA positive for a small amount of leukocytes and trace blood. UPT was negative.  Urine culture is pending. Appears to be burning from vaginal irration when  urinating. No CVA tenderness on exam or abdominal pain. Will wait for culture results prior to starting ABX. Strict ED precautions were given and patient verbalized understanding.  ED Discharge Orders          Ordered    azithromycin (ZITHROMAX Z-PAK) 250 MG tablet        12/08/23 1629    fluconazole (DIFLUCAN) 150 MG tablet  every 72 hours        12/08/23 1629             PDMP not reviewed this encounter.     Avi Body, PA-C 12/08/23 1636

## 2023-12-08 NOTE — ED Triage Notes (Signed)
 Patient states she was recently treated for BV and started having "yeast" white vaginal discharge. C/O sore throat. Patient states her urine is orange as well. Patient states she is currently on antibiotic for BV

## 2023-12-08 NOTE — Discharge Instructions (Addendum)
 Your strep test was positive. Recommend salt water  gargling and over-the-counter Ibuprofen  as needed for pain if you are not allergic. I have sent a prescription called Zithromax to your pharmacy. This is an antibiotic used to treat strep. Return in 2 to 3 days if no improvement. Please go directly to the Emergency Department immediately should you begin to have any of the following symptoms: increased pain, persistent fevers, difficulty swallowing, difficulty talking, drooling or difficulty breathing.   Your swab was sent to the lab for further testing.  You will be called with results. Diflucan is a prescription used to treat yeast infections. Take the prescription as directed.  You should avoid all sexual activity until you have been notified of all your results and have undergone any necessary treatment.  If you are positive, it is recommended that you inform all sexual partners so they can treat be treated as well before having sex again.   Finally, your urine was sent to the lab as well for further testing. We will call you with those results.  If the prescription needs to be changed, it will be done so at that time. Return in 3-4 days if no improvement. Please go directly to the Emergency Department immediately should you begin to have any of the following symptoms: persistent fevers, increased pain or persistent nausea/vomiting.

## 2023-12-09 LAB — URINE CULTURE: Culture: 4000 — AB

## 2023-12-10 LAB — CERVICOVAGINAL ANCILLARY ONLY
Bacterial Vaginitis (gardnerella): POSITIVE — AB
Candida Glabrata: NEGATIVE
Candida Vaginitis: POSITIVE — AB
Chlamydia: NEGATIVE
Comment: NEGATIVE
Comment: NEGATIVE
Comment: NEGATIVE
Comment: NEGATIVE
Comment: NEGATIVE
Comment: NORMAL
Neisseria Gonorrhea: NEGATIVE
Trichomonas: NEGATIVE

## 2023-12-11 ENCOUNTER — Ambulatory Visit (HOSPITAL_COMMUNITY): Payer: Self-pay

## 2023-12-12 MED ORDER — METRONIDAZOLE 0.75 % VA GEL: 1.0000 | Freq: Every day | VAGINAL | 0 refills | Status: AC

## 2024-05-01 ENCOUNTER — Emergency Department (HOSPITAL_COMMUNITY)
Admission: EM | Admit: 2024-05-01 | Discharge: 2024-05-01 | Disposition: A | Discharge status: Home / Self Care | Attending: Emergency Medicine | Admitting: Emergency Medicine

## 2024-05-01 ENCOUNTER — Emergency Department (HOSPITAL_COMMUNITY)

## 2024-05-01 ENCOUNTER — Other Ambulatory Visit: Payer: Self-pay

## 2024-05-01 ENCOUNTER — Encounter (HOSPITAL_COMMUNITY): Payer: Self-pay | Admitting: Emergency Medicine

## 2024-05-01 DIAGNOSIS — S0990XA Unspecified injury of head, initial encounter: Secondary | ICD-10-CM | POA: Diagnosis not present

## 2024-05-01 DIAGNOSIS — S0181XA Laceration without foreign body of other part of head, initial encounter: Secondary | ICD-10-CM | POA: Diagnosis not present

## 2024-05-01 DIAGNOSIS — Y92410 Unspecified street and highway as the place of occurrence of the external cause: Secondary | ICD-10-CM | POA: Diagnosis not present

## 2024-05-01 DIAGNOSIS — S0121XA Laceration without foreign body of nose, initial encounter: Secondary | ICD-10-CM | POA: Diagnosis not present

## 2024-05-01 DIAGNOSIS — S022XXA Fracture of nasal bones, initial encounter for closed fracture: Secondary | ICD-10-CM | POA: Insufficient documentation

## 2024-05-01 DIAGNOSIS — G4489 Other headache syndrome: Secondary | ICD-10-CM | POA: Diagnosis not present

## 2024-05-01 MED ORDER — ONDANSETRON HCL 4 MG/2ML IJ SOLN
4.0000 mg | Freq: Once | INTRAMUSCULAR | Status: AC
  Administered 2024-05-01: 4 mg via INTRAVENOUS
  Filled 2024-05-01: qty 2

## 2024-05-01 MED ORDER — METAXALONE 800 MG PO TABS: 800.0000 mg | ORAL_TABLET | Freq: Three times a day (TID) | ORAL | 0 refills | Status: AC

## 2024-05-01 MED ORDER — TETANUS-DIPHTH-ACELL PERTUSSIS 5-2-15.5 LF-MCG/0.5 IM SUSP
0.5000 mL | Freq: Once | INTRAMUSCULAR | Status: AC
  Administered 2024-05-01: 0.5 mL via INTRAMUSCULAR
  Filled 2024-05-01: qty 0.5

## 2024-05-01 MED ORDER — MORPHINE SULFATE (PF) 2 MG/ML IV SOLN
2.0000 mg | Freq: Once | INTRAVENOUS | Status: AC
  Administered 2024-05-01: 2 mg via INTRAVENOUS
  Filled 2024-05-01: qty 1

## 2024-05-01 MED ORDER — LIDOCAINE-EPINEPHRINE-TETRACAINE (LET) TOPICAL GEL
3.0000 mL | Freq: Once | TOPICAL | Status: AC
  Administered 2024-05-01: 3 mL via TOPICAL
  Filled 2024-05-01: qty 3

## 2024-05-01 MED ORDER — LIDOCAINE HCL (PF) 1 % IJ SOLN
5.0000 mL | Freq: Once | INTRAMUSCULAR | Status: AC
  Administered 2024-05-01: 5 mL
  Filled 2024-05-01: qty 5
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
## 2024-05-05 DIAGNOSIS — S022XXD Fracture of nasal bones, subsequent encounter for fracture with routine healing: Secondary | ICD-10-CM | POA: Diagnosis not present
